# Patient Record
Sex: Male | Born: 1955 | Race: White | Hispanic: No | Marital: Married | Smoking: Never smoker
Health system: Southern US, Community
[De-identification: ages and names within clinical notes are randomized; demographics above are authoritative.]

## PROBLEM LIST (undated history)

## (undated) DIAGNOSIS — R51 Headache: Secondary | ICD-10-CM

## (undated) DIAGNOSIS — F329 Major depressive disorder, single episode, unspecified: Secondary | ICD-10-CM

## (undated) DIAGNOSIS — R519 Headache, unspecified: Secondary | ICD-10-CM

## (undated) DIAGNOSIS — G4733 Obstructive sleep apnea (adult) (pediatric): Secondary | ICD-10-CM

## (undated) DIAGNOSIS — Z8489 Family history of other specified conditions: Secondary | ICD-10-CM

## (undated) DIAGNOSIS — F419 Anxiety disorder, unspecified: Secondary | ICD-10-CM

## (undated) DIAGNOSIS — F32A Depression, unspecified: Secondary | ICD-10-CM

## (undated) DIAGNOSIS — R7303 Prediabetes: Secondary | ICD-10-CM

## (undated) DIAGNOSIS — H539 Unspecified visual disturbance: Secondary | ICD-10-CM

## (undated) DIAGNOSIS — G4700 Insomnia, unspecified: Secondary | ICD-10-CM

## (undated) HISTORY — DX: Obstructive sleep apnea (adult) (pediatric): G47.33

## (undated) HISTORY — DX: Anxiety disorder, unspecified: F41.9

## (undated) HISTORY — PX: DENTAL SURGERY: SHX609

## (undated) HISTORY — DX: Unspecified visual disturbance: H53.9

## (undated) HISTORY — DX: Major depressive disorder, single episode, unspecified: F32.9

## (undated) HISTORY — DX: Insomnia, unspecified: G47.00

## (undated) HISTORY — DX: Headache, unspecified: R51.9

## (undated) HISTORY — PX: VASECTOMY: SHX75

## (undated) HISTORY — DX: Headache: R51

## (undated) HISTORY — DX: Depression, unspecified: F32.A

---

## 2000-11-23 ENCOUNTER — Emergency Department (HOSPITAL_COMMUNITY): Admission: EM | Admit: 2000-11-23 | Discharge: 2000-11-24 | Payer: Self-pay | Admitting: Internal Medicine

## 2011-08-23 ENCOUNTER — Ambulatory Visit (INDEPENDENT_AMBULATORY_CARE_PROVIDER_SITE_OTHER): Payer: 59

## 2011-08-23 DIAGNOSIS — Z23 Encounter for immunization: Secondary | ICD-10-CM

## 2014-02-05 ENCOUNTER — Ambulatory Visit (INDEPENDENT_AMBULATORY_CARE_PROVIDER_SITE_OTHER): Payer: Managed Care, Other (non HMO) | Admitting: Physician Assistant

## 2014-02-05 VITALS — BP 94/60 | HR 67 | Temp 97.8°F | Ht 67.5 in | Wt 199.2 lb

## 2014-02-05 DIAGNOSIS — Z23 Encounter for immunization: Secondary | ICD-10-CM

## 2014-02-05 NOTE — Progress Notes (Signed)
Presents for Tdap vaccination.  First grandchild is due this summer.  1. Need for Tdap vaccination - Tdap vaccine greater than or equal to 58yo IM   Jackson Fetters S. Shaleta Ruacho, PA-C Physician Assistant-Certified [facility]  

## 2014-02-05 NOTE — Patient Instructions (Signed)
Congratulations!        PRE-OPERATIVE INSTRUCTIONS        Please do the body wash as directed below  Do not smoke after midnight the night before surgery - this will increase your risk of nausea and vomiting  Do not eat or drink anything for 8 hours before surgery. Failure to do this may cause cancellation of your procedure. You may drink CLEAR beverages (that you can see through) and your Ensure up to 2 hours before the procedure.   Please DO take the following medications with a sip of water on the day of your surgery: prisolec, miralax       Please go get your blood drawn on Friday, 04/25/22.   [facility] (inside main [facility])  [address], [address]  [place] [address]  [phone]  Monday-Friday: 7:30 a.m. - 5 p.m.  Sunday: 7:30 - 10:30 a.m.   Closed on Saturdays   * Will open at 7 a.m. Sundays starting December 23, 2020    [facility]  [address]  [place] [address]  [phone]  Monday-Friday: 8 a.m. - 4:30 p.m.    [facility]  [address]  [place] [address]  [phone]  Monday-Friday: 7:30 a.m. - 4:30 p.m.   *Appointment required      Day of Surgery  On the day of your surgery please leave valuable items including cash and credit cards at home if you can. You can expect:  You will be asked to remove any of the following items:  Dentures and bridges  Hearing aids  Contact lenses  Nail polish from at least one finger nail  Wigs, hairpins, combs and barrettes  You will be asked to remove all your clothes and put on a special gown  Your nursing staff may start an intravenous line (IV) attached to a tube that will supply your body with fluids and medications during and after surgery.      Preparing for your surgery - Chlorhexidine Washes    Shower with Chlorhexidine (CHG) soap to prevent infection.    Instructions:  You should shower with CHG soap a minimum of three times before your surgery, or more often as directed by  your surgeon.  You can buy CHG soap over the counter at any drug store.    Showering several times before surgery blocks germ growth and provides the best protection when used at least 3 times in a row.    two nights before surgery  the night before surgery  the morning of the admission to surgery    How to shower with CHG soap:    1. Rinse your body with warm water.    5. Lather again before rinsing.   2. Wash your hair with regular shampoo.  Rinse your hair with water. If you are having  neck surgery, use CHG soap instead of your regular shampoo to wash your hair.  Rinse your hair with water.    6.      7.   Turn on the water and rinse CHG off your body.    Dry off with a clean towel.   3. Wet a clean sponge. Turn off the water. Apply CHG liberally.  8. Do not apply lotions or powders.       4. Firmly massage all areas: neck, arms, chest, back, abdomen, hips, groin, genitals  (external only) and   buttocks. Clean your legs and feet and between your fingers and  toes. Pay special attention to the site of your surgery and all surrounding skin. Ask for help to clean your back if you have a spinal surgery.  9. Use clean clothes and freshly laundered bed linens.     --- Repeat steps 1 - 9  each time you shower. ---     Caution: When using CHG soap, avoid contact with eyes, nose, ear canals and mouth.  Important reminders:    Do not use any other soaps or body wash when using CHG. Other soaps can block the CHG benefits.  After showering, do not apply lotion, cream, powder, deodorant, or hair conditioner.  Do not shave or remove body hair. Facial shaving is permitted. If you are having head surgery, ask your doctor whether you can shave.  CHG is safe to use on minor wounds, rashes, burns, and over staples and stiches.  Allergic reactions are rare but may occur. If you have an allergic reaction, stop using CHG and call your doctor if you have a skin irritation.  If you are allergic to CHG, please follow the bathing  instructions above using an over-the-counter regular soap instead of CHG.

## 2014-04-01 ENCOUNTER — Ambulatory Visit (INDEPENDENT_AMBULATORY_CARE_PROVIDER_SITE_OTHER): Payer: Managed Care, Other (non HMO) | Admitting: Family Medicine

## 2014-04-01 VITALS — BP 104/72 | HR 72 | Temp 97.9°F | Resp 16 | Ht 68.0 in | Wt 194.8 lb

## 2014-04-01 DIAGNOSIS — M79609 Pain in unspecified limb: Secondary | ICD-10-CM

## 2014-04-01 DIAGNOSIS — S79929A Unspecified injury of unspecified thigh, initial encounter: Secondary | ICD-10-CM

## 2014-04-01 DIAGNOSIS — M79662 Pain in left lower leg: Secondary | ICD-10-CM

## 2014-04-01 DIAGNOSIS — S79919A Unspecified injury of unspecified hip, initial encounter: Secondary | ICD-10-CM

## 2014-04-01 DIAGNOSIS — S76302A Unspecified injury of muscle, fascia and tendon of the posterior muscle group at thigh level, left thigh, initial encounter: Secondary | ICD-10-CM

## 2014-04-01 NOTE — Progress Notes (Signed)
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 He comes in with left posterior thigh pain following a water ski injury. He noticed a sharp pain when he got up on his skis one week ago. He then developed persistent soreness in the mid thigh as well as a large ecchymotic area. He took measures to help us with icing it and taking ibuprofen and the pain is somewhat less now.  He seeks advice on what to do next.  Objective: Articulate gentleman in no acute distress Examination the left thigh reveals a 10 cm area of dense ecchymosis and diffuse swelling.  Assessment: Hamstring tear  Plan: Gentle stretching including swimming, continue the icing and ibuprofen. Expect this will take a month to heal.  Signed, Sheila OatsKurt Ottis Vacha M.D.

## 2014-12-14 DIAGNOSIS — G4700 Insomnia, unspecified: Secondary | ICD-10-CM | POA: Insufficient documentation

## 2014-12-14 DIAGNOSIS — M545 Low back pain, unspecified: Secondary | ICD-10-CM | POA: Insufficient documentation

## 2015-06-16 ENCOUNTER — Ambulatory Visit (INDEPENDENT_AMBULATORY_CARE_PROVIDER_SITE_OTHER): Payer: Managed Care, Other (non HMO) | Admitting: Family Medicine

## 2015-06-16 VITALS — BP 128/72 | HR 77 | Temp 98.0°F | Resp 18 | Ht 69.0 in | Wt 179.0 lb

## 2015-06-16 DIAGNOSIS — J069 Acute upper respiratory infection, unspecified: Secondary | ICD-10-CM | POA: Diagnosis not present

## 2015-06-16 DIAGNOSIS — R05 Cough: Secondary | ICD-10-CM | POA: Diagnosis not present

## 2015-06-16 DIAGNOSIS — R059 Cough, unspecified: Secondary | ICD-10-CM

## 2015-06-16 MED ORDER — AZITHROMYCIN 250 MG PO TABS: ORAL_TABLET | ORAL | Status: DC

## 2015-06-16 MED ORDER — HYDROCODONE-HOMATROPINE 5-1.5 MG/5ML PO SYRP: 5.0000 mL | ORAL_SOLUTION | ORAL | Status: DC | PRN

## 2015-06-16 MED ORDER — BENZONATATE 100 MG PO CAPS: 100.0000 mg | ORAL_CAPSULE | Freq: Three times a day (TID) | ORAL | Status: DC | PRN

## 2015-06-16 NOTE — Progress Notes (Signed)
Patient ID: Tom Webb, male    DOB: Feb 02, 1956  Age: 59 y.o. MRN: [id number]  Chief Complaint  Patient presents with  . Nasal Congestion    x 5 days  . Cough  . Sore Throat    Subjective:   59 year old man who has been ill all week with a respiratory tract infection. He has had head congestion, sore throat, and cough. The cough pretty persistent. Coworkers and wife insisted he get some additional care. He has not been running fevers. He is coughing up some mucus which is white and yellow. He has had head congestion and some sore throat. He does not smoke. He commutes over to [place] where he works, lives in [place]. Gen. he is a healthy man.  Current allergies, medications, problem list, past/family and social histories reviewed.  Objective:  BP 128/72 mmHg  Pulse 77  Temp(Src) 98 F (36.7 C)  Resp 18  Ht  (1.753 m)  Wt 179 lb (81.194 kg)  BMI 26.42 kg/m2  SpO2 98%  No major acute distress. His TMs are normal except for a little tympanosclerosis on the right drum. His throat is clear. Neck supple without nodes or thyromegaly. Chest is clear to auscultation. Only a little bit tight with forced expiration. No significant wheezing.  Assessment & Plan:   Assessment: 1. Viral upper respiratory infection   2. Cough       Plan: Will treat symptomatically with antibiotics backup if not improving    Meds ordered this encounter  Medications  . Acetaminophen (TYLENOL 8 HOUR ARTHRITIS PAIN PO)    Sig: Take by mouth.  Marland Kitchen aspirin 81 MG tablet    Sig: Take 81 mg by mouth daily.         Patient Instructions  Drink lots of fluids and get enough rest  Take the benzonatate 1 or 2 pills 3 times daily as needed for daytime cough  Take the Hycodan cough syrup 1 teaspoon every 4-6 hours as needed. Primarily use this at nighttime on weekends when can tolerate being a little bit sedated  Continues to use the Zyrtec. You might consider Zyrtec-D if the head seems to  congested.  If the secretions aren't too thick when you're coughing consider adding plain Mucinex to the above regimen  If you aren't getting worse, running fever, coughing up more foul mucus, etc., then begin the azithromycin which was 2 tablets initially then 1 daily for 4 days  If you do not use the antibiotics placed just destroyed the prescription  Return as needed     Return if symptoms worsen or fail to improve.   HOPPER,DAVID, MD 06/16/2015

## 2015-06-16 NOTE — Patient Instructions (Signed)
Drink lots of fluids and get enough rest  Take the benzonatate 1 or 2 pills 3 times daily as needed for daytime cough  Take the Hycodan cough syrup 1 teaspoon every 4-6 hours as needed. Primarily use this at nighttime on weekends when can tolerate being a little bit sedated  Continues to use the Zyrtec. You might consider Zyrtec-D if the head seems to congested.  If the secretions aren't too thick when you're coughing consider adding plain Mucinex to the above regimen  If you aren't getting worse, running fever, coughing up more foul mucus, etc., then begin the azithromycin which was 2 tablets initially then 1 daily for 4 days  If you do not use the antibiotics placed just destroyed the prescription  Return as needed

## 2015-12-20 DIAGNOSIS — F419 Anxiety disorder, unspecified: Secondary | ICD-10-CM | POA: Insufficient documentation

## 2016-02-12 ENCOUNTER — Ambulatory Visit (INDEPENDENT_AMBULATORY_CARE_PROVIDER_SITE_OTHER): Payer: 59

## 2016-02-12 ENCOUNTER — Ambulatory Visit (INDEPENDENT_AMBULATORY_CARE_PROVIDER_SITE_OTHER): Payer: 59 | Admitting: Podiatry

## 2016-02-12 ENCOUNTER — Encounter: Payer: Self-pay | Admitting: Podiatry

## 2016-02-12 VITALS — BP 96/57 | HR 77 | Resp 16

## 2016-02-12 DIAGNOSIS — L603 Nail dystrophy: Secondary | ICD-10-CM

## 2016-02-12 DIAGNOSIS — M205X2 Other deformities of toe(s) (acquired), left foot: Secondary | ICD-10-CM

## 2016-02-12 MED ORDER — MELOXICAM 15 MG PO TABS: 15.0000 mg | ORAL_TABLET | Freq: Every day | ORAL | Status: DC

## 2016-02-12 NOTE — Progress Notes (Signed)
   Subjective:    Patient ID: Tom Webb, male    DOB: 02/03/1956, 60 y.o.   MRN: [id number]  HPI    Review of Systems  Musculoskeletal: Positive for arthralgias.  All other systems reviewed and are negative.      Objective:   Physical Exam        Assessment & Plan:

## 2016-02-12 NOTE — Progress Notes (Signed)
   Subjective:    Patient ID: Tom Webb, male    DOB: 12/17/1955, 60 y.o.   MRN: [id number]   Subjective: He presents today with a 4 year history of pain to the first metatarsophalangeal joint of the left foot. He states the elliptical and hurts while walking and exercising. He saw an orthopedic surgeon approximately 2-3 years ago who recommended surgical fusion of the first metatarsophalangeal joint he declined at the time in lieu of an injection to the first metatarsophalangeal joint which resolved his symptoms for period of time. He currently takes arthritis strength Tylenol for his symptoms. He will be leaving for [place] next week and will be doing a lot of walking and requested discussion and possible medication. Foot Pain Associated symptoms include arthralgias.      Review of Systems  Musculoskeletal: Positive for arthralgias.  All other systems reviewed and are negative.      Objective:   Physical Exam: Vital signs are stable he is alert and oriented 3 have reviewed his past medical history medications allergies surgery social history and review of systems very pulses are strongly palpable. Neurologic sensorium is intact person's was C monofilament. Deep tendon reflexes are intact. Muscle strength is normal. He has very little in the way of motion for the first metatarsophalangeal joint which is painful on palpation as well as range of motion. Radiographs taken today do demonstrate moderate to severe osteoarthritic changes with dorsal spurring of the first metatarsophalangeal joint joint space narrowing eccentric in nature consistent with osteoarthritis of the first metatarsophalangeal joint of the left foot. He does have relatively normal cutaneous evaluation with exception of some discoloration to the medial aspect of the hallux nail plate left foot. He states this has come up recently and he would like to consider having this checked.        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  Plan: We discussed in great detail today her surgical options consisting of fusion versus arthroplasty with a silicone implant. I injected the area today after sterile Betadine skin prep with dexamethasone directly into the joint. This should allow for some alleviation of his symptoms. Also wrote him a prescription for meloxicam 15 mg 1 by mouth daily and I will follow-up with him once he returns from [place]. Samples of the nail skin were taken today to be sent for pathologic evaluation.

## 2016-03-13 ENCOUNTER — Encounter: Payer: Self-pay | Admitting: Podiatry

## 2016-03-13 ENCOUNTER — Ambulatory Visit (INDEPENDENT_AMBULATORY_CARE_PROVIDER_SITE_OTHER): Payer: 59 | Admitting: Podiatry

## 2016-03-13 VITALS — BP 104/69 | HR 88 | Resp 12

## 2016-03-13 DIAGNOSIS — M205X2 Other deformities of toe(s) (acquired), left foot: Secondary | ICD-10-CM

## 2016-03-13 DIAGNOSIS — L603 Nail dystrophy: Secondary | ICD-10-CM | POA: Diagnosis not present

## 2016-03-13 NOTE — Progress Notes (Signed)
He presents today for follow-up of his first metatarsophalangeal joint as well as the fungal biopsy. He states that while he was initially he did fairly well with the first metatarsophalangeal joint and the hallux limitus. He stopped taking his meloxicam last Thursday. He states that the pain level is around 1-2 generally.  Objective: Vital signs are stable he is alert and oriented 3 much decrease in edema and erythema around the first metatarsophalangeal joint left foot there is still severe hallux limitus present with crepitation. He does have a mold infection to his hallux nail plate.  Assessment: Nail dystrophy with onychomycosis and hallux limitus first metatarsal phalangeal joint left foot.  Plan: We discussed possible need for joint replacement today and also discussed the medication for him he would like to not take medication at this point time would like to consider doing this later. I will follow-up with him as needed.

## 2016-03-21 ENCOUNTER — Encounter: Payer: Self-pay | Admitting: Podiatry

## 2017-01-16 ENCOUNTER — Telehealth: Payer: Self-pay | Admitting: *Deleted

## 2017-01-16 MED ORDER — MELOXICAM 15 MG PO TABS: 15.0000 mg | ORAL_TABLET | Freq: Every day | ORAL | 0 refills | Status: DC

## 2017-01-16 NOTE — Telephone Encounter (Signed)
Toniann FailWendy [facility] states has sent refill request 2 times. I told her pt had not been seen since 03/2016 and needed an appt, but I would refill to get to the appt date.

## 2017-02-26 ENCOUNTER — Other Ambulatory Visit: Payer: Self-pay | Admitting: Family Medicine

## 2017-02-26 DIAGNOSIS — G8929 Other chronic pain: Secondary | ICD-10-CM

## 2017-02-26 DIAGNOSIS — M545 Low back pain: Principal | ICD-10-CM

## 2017-03-05 ENCOUNTER — Ambulatory Visit
Admission: RE | Admit: 2017-03-05 | Discharge: 2017-03-05 | Disposition: A | Discharge status: Home / Self Care | Payer: 59 | Source: Ambulatory Visit | Attending: Family Medicine | Admitting: Family Medicine

## 2017-03-05 DIAGNOSIS — M545 Low back pain: Principal | ICD-10-CM

## 2017-03-05 DIAGNOSIS — G8929 Other chronic pain: Secondary | ICD-10-CM

## 2017-03-05 MED ORDER — IOPAMIDOL (ISOVUE-M 200) INJECTION 41%
1.0000 mL | Freq: Once | INTRAMUSCULAR | Status: AC
  Administered 2017-03-05: 1 mL via EPIDURAL

## 2017-03-05 MED ORDER — METHYLPREDNISOLONE ACETATE 40 MG/ML INJ SUSP (RADIOLOG
120.0000 mg | Freq: Once | INTRAMUSCULAR | Status: AC
  Administered 2017-03-05: 120 mg via EPIDURAL

## 2017-03-05 NOTE — Discharge Instructions (Signed)

## 2017-07-20 ENCOUNTER — Ambulatory Visit (INDEPENDENT_AMBULATORY_CARE_PROVIDER_SITE_OTHER): Payer: 59 | Admitting: Family Medicine

## 2017-07-20 DIAGNOSIS — Z23 Encounter for immunization: Secondary | ICD-10-CM | POA: Diagnosis not present

## 2017-08-13 ENCOUNTER — Other Ambulatory Visit: Payer: Self-pay | Admitting: Family Medicine

## 2017-08-14 ENCOUNTER — Other Ambulatory Visit: Payer: Self-pay | Admitting: Family Medicine

## 2017-08-14 DIAGNOSIS — G8929 Other chronic pain: Secondary | ICD-10-CM

## 2017-08-14 DIAGNOSIS — M545 Low back pain: Principal | ICD-10-CM

## 2017-08-27 ENCOUNTER — Ambulatory Visit
Admission: RE | Admit: 2017-08-27 | Discharge: 2017-08-27 | Disposition: A | Discharge status: Home / Self Care | Payer: 59 | Source: Ambulatory Visit | Attending: Family Medicine | Admitting: Family Medicine

## 2017-08-27 ENCOUNTER — Other Ambulatory Visit: Payer: Self-pay | Admitting: Family Medicine

## 2017-08-27 DIAGNOSIS — G8929 Other chronic pain: Secondary | ICD-10-CM

## 2017-08-27 DIAGNOSIS — M545 Low back pain: Principal | ICD-10-CM

## 2017-08-27 MED ORDER — IOPAMIDOL (ISOVUE-M 200) INJECTION 41%: 1.0000 mL | Freq: Once | INTRAMUSCULAR | Status: DC

## 2017-08-27 MED ORDER — METHYLPREDNISOLONE ACETATE 40 MG/ML INJ SUSP (RADIOLOG: 120.0000 mg | Freq: Once | INTRAMUSCULAR | Status: DC

## 2017-08-27 NOTE — Discharge Instructions (Signed)

## 2018-02-01 ENCOUNTER — Encounter: Payer: Self-pay | Admitting: Family Medicine

## 2018-05-05 ENCOUNTER — Other Ambulatory Visit: Payer: Self-pay | Admitting: Family Medicine

## 2018-05-05 DIAGNOSIS — M545 Low back pain: Principal | ICD-10-CM

## 2018-05-05 DIAGNOSIS — G8929 Other chronic pain: Secondary | ICD-10-CM

## 2018-05-19 ENCOUNTER — Other Ambulatory Visit: Payer: Self-pay | Admitting: Family Medicine

## 2018-05-19 ENCOUNTER — Ambulatory Visit
Admission: RE | Admit: 2018-05-19 | Discharge: 2018-05-19 | Disposition: A | Discharge status: Home / Self Care | Payer: 59 | Source: Ambulatory Visit | Attending: Family Medicine | Admitting: Family Medicine

## 2018-05-19 DIAGNOSIS — M545 Low back pain: Principal | ICD-10-CM

## 2018-05-19 DIAGNOSIS — G8929 Other chronic pain: Secondary | ICD-10-CM

## 2018-05-19 MED ORDER — IOPAMIDOL (ISOVUE-M 200) INJECTION 41%
1.0000 mL | Freq: Once | INTRAMUSCULAR | Status: AC
  Administered 2018-05-19: 1 mL via EPIDURAL

## 2018-05-19 MED ORDER — METHYLPREDNISOLONE ACETATE 40 MG/ML INJ SUSP (RADIOLOG
120.0000 mg | Freq: Once | INTRAMUSCULAR | Status: AC
  Administered 2018-05-19: 120 mg via EPIDURAL

## 2018-05-19 NOTE — Discharge Instructions (Signed)

## 2018-07-27 ENCOUNTER — Ambulatory Visit (INDEPENDENT_AMBULATORY_CARE_PROVIDER_SITE_OTHER): Payer: 59 | Admitting: Neurology

## 2018-07-27 ENCOUNTER — Other Ambulatory Visit: Payer: Self-pay

## 2018-07-27 ENCOUNTER — Encounter: Payer: Self-pay | Admitting: Neurology

## 2018-07-27 DIAGNOSIS — H532 Diplopia: Secondary | ICD-10-CM | POA: Diagnosis not present

## 2018-07-27 NOTE — Progress Notes (Signed)
Reason for visit: Diplopia  Referring physician: Dr. Gershon Mussel is a 62 y.o. male  History of present illness:  Mr. Nikolai is a 62 year old right-handed white male with a history of an event that occurred on 03 Feb 2018.  The patient was in [place].  He had finished the canoeing, he was starting to develop some low back pain.  The patient took a Flexeril that afternoon.  Within 2 hours, the patient began having double vision that was horizontal in nature.  He remembers feeling spacey as if his mind and body were detached from one another.  He started slurring his speech, he was staggery.  His family called 911 and he was transported to the hospital.  The timeframe of symptoms lasted about 4 or 5 hours.  The patient had a patchy recollection of events during this timeframe, he did not D[place] out completely.  Eventually, he returned back to normal.  He has not taken any Flexeril since.  He had just started Flexeril 1 week prior to the event described above.  He had taken two previous tablets of Flexeril, both at nighttime.  The day of the onset of double vision was the first time he had taken the Flexeril during the daytime.  He has not taken Flexeril since that time.  The patient reported no focal numbness or weakness of the face, arms, legs.  He had no headache the day of the confusional event.  The patient has subsequently been placed on CPAP for sleep apnea.  The patient does have an occasional headache, but no clear history of migraine.  He is sent to this office for an evaluation.  A CT scan of the brain was done and was unremarkable.  Past Medical History:  Diagnosis Date  . Anxiety   . Depression   . Headache   . Insomnia   . OSA (obstructive sleep apnea)   . Vision abnormalities     Past Surgical History:  Procedure Laterality Date  . DENTAL SURGERY    . VASECTOMY      Family History  Problem Relation Age of Onset  . Tremor Mother   .  Syncope episode Father   . Cancer Paternal Grandfather   . Angioedema Sister   . Angioedema Brother     Social history:  reports that he has never smoked. He does not have any smokeless tobacco history on file. He reports that he does not drink alcohol. His drug history is not on file.  Medications:  Prior to Admission medications   Medication Sig Start Date End Date Taking? Authorizing Provider  Acetaminophen (TYLENOL 8 HOUR ARTHRITIS PAIN PO) Take by mouth.   Yes [provider]  aspirin EC 81 MG tablet Take by mouth.   Yes [provider]  buPROPion (WELLBUTRIN) 100 MG tablet Take 100 mg by mouth 2 (two) times daily.   Yes [provider]  escitalopram (LEXAPRO) 10 MG tablet Take by mouth. 12/20/15  Yes [provider]  meloxicam (MOBIC) 15 MG tablet Take 1 tablet (15 mg total) by mouth daily. 02/12/16  Yes Hyatt, Max T, DPM  zolpidem (AMBIEN) 10 MG tablet TAKE 1 TABLET AT BEDTIME AS NEEDED. 04/05/18  Yes [provider]     No Known Allergies  ROS:  Out of a complete 14 system review of symptoms, the patient complains only of the following symptoms, and all other reviewed systems are negative.  Transient  double vision Right-sided sciatica  Blood pressure 130/81, pulse 76, height 5' 7.5" (1.715 m), weight 194 lb (88 kg).  Physical Exam  General: The patient is alert and cooperative at the time of the examination.  Eyes: Pupils are equal, round, and reactive to light. Discs are flat bilaterally.  Neck: The neck is supple, no carotid bruits are noted.  Respiratory: The respiratory examination is clear.  Cardiovascular: The cardiovascular examination reveals a regular rate and rhythm, no obvious murmurs or rubs are noted.  Skin: Extremities are without significant edema.  Neurologic Exam  Mental status: The patient is alert and oriented x 3 at the time of the examination. The patient has apparent normal recent and remote memory,  with an apparently normal attention span and concentration ability.  Cranial nerves: Facial symmetry is present. There is good sensation of the face to pinprick and soft touch bilaterally. The strength of the facial muscles and the muscles to head turning and shoulder shrug are normal bilaterally. Speech is well enunciated, no aphasia or dysarthria is noted. Extraocular movements are full. Visual fields are full. The tongue is midline, and the patient has symmetric elevation of the soft palate. No obvious hearing deficits are noted.  Motor: The motor testing reveals 5 over 5 strength of all 4 extremities. Good symmetric motor tone is noted throughout.  Sensory: Sensory testing is intact to pinprick, soft touch, vibration sensation, and position sense on all 4 extremities. No evidence of extinction is noted.  Coordination: Cerebellar testing reveals good finger-nose-finger and heel-to-shin bilaterally.  Gait and station: Gait is normal. Tandem gait is normal. Romberg is negative. No drift is seen.  Reflexes: Deep tendon reflexes are symmetric and normal bilaterally. Toes are downgoing bilaterally.   Assessment/Plan:  1.  Transient alteration of awareness, double vision  The patient likely had an event associated with the daytime use of Flexeril.  Flexeril is highly anticholinergic and can cause double vision in someone with latent strabismus.  In susceptible individuals, this may cause confusion, slurred speech, and gait instability.  The patient will be sent for MRI of the brain, if there is no evidence of cerebrovascular disease, no further work-up will be done.  If an old stroke in the brainstem area is noted, further evaluation may be warranted.  The patient currently is on low-dose aspirin.  Marlan Palau. Keith Willis MD 07/27/2018 3:30 PM  [facility] [address] [place], [place] [id number]  Phone [phone] Fax [phone]

## 2018-07-28 ENCOUNTER — Telehealth: Payer: Self-pay | Admitting: Neurology

## 2018-07-28 NOTE — Telephone Encounter (Signed)
Patient is aware I gave him GI phone number of [phone] and to give them a call if he has not heard in the next 2-3 business days.  °

## 2018-07-28 NOTE — Telephone Encounter (Signed)
[employer] order sent to GI. They obtain the auth and will reach out to the pt to schedule.  °

## 2018-08-15 ENCOUNTER — Telehealth: Payer: Self-pay | Admitting: Neurology

## 2018-08-15 ENCOUNTER — Ambulatory Visit
Admission: RE | Admit: 2018-08-15 | Discharge: 2018-08-15 | Disposition: A | Discharge status: Home / Self Care | Payer: 59 | Source: Ambulatory Visit | Attending: Neurology | Admitting: Neurology

## 2018-08-15 DIAGNOSIS — H532 Diplopia: Secondary | ICD-10-CM

## 2018-08-15 NOTE — Telephone Encounter (Signed)
  I called the patient. MRI of the brain is normal.   MRI brain 08/15/18:  IMPRESSION:  Unremarkable MRI scan of brain without contrast.

## 2019-01-24 DIAGNOSIS — Z9989 Dependence on other enabling machines and devices: Secondary | ICD-10-CM | POA: Insufficient documentation

## 2019-06-17 ENCOUNTER — Other Ambulatory Visit: Payer: Self-pay

## 2019-06-17 DIAGNOSIS — Z20822 Contact with and (suspected) exposure to covid-19: Secondary | ICD-10-CM

## 2019-06-18 LAB — NOVEL CORONAVIRUS, NAA: SARS-CoV-2, NAA: NOT DETECTED

## 2019-09-10 ENCOUNTER — Encounter (HOSPITAL_COMMUNITY): Payer: Self-pay

## 2019-09-10 ENCOUNTER — Ambulatory Visit (HOSPITAL_COMMUNITY)
Admission: EM | Admit: 2019-09-10 | Discharge: 2019-09-10 | Disposition: A | Discharge status: Home / Self Care | Payer: BC Managed Care – PPO | Attending: Family Medicine | Admitting: Family Medicine

## 2019-09-10 DIAGNOSIS — Z20822 Contact with and (suspected) exposure to covid-19: Secondary | ICD-10-CM | POA: Diagnosis not present

## 2019-09-10 NOTE — ED Triage Notes (Signed)
Pt has been exposed by someone who possible has covid 19 depending on when that person receive there lab result. Pt denies any symptoms.

## 2019-09-10 NOTE — Discharge Instructions (Addendum)
If your Covid-19 test is positive, you will receive a phone call from [facility] regarding your results. Negative test results are not called. Both positive and negative results area always visible on MyChart. If you do not have a MyChart account, sign up instructions are in your discharge papers.  

## 2019-09-11 LAB — NOVEL CORONAVIRUS, NAA (HOSP ORDER, SEND-OUT TO REF LAB; TAT 18-24 HRS): SARS-CoV-2, NAA: NOT DETECTED

## 2019-09-12 NOTE — ED Provider Notes (Signed)
[facility]   [id number] 09/10/19 Arrival Time: 1539  ASSESSMENT & PLAN:  1. Exposure to COVID-19 virus      COVID-19 testing sent. To self-quarantine until results are available. If requested, work note provided.  Follow-up Information    Kandyce Rud, MD.   Specialty: Family Medicine Why: As needed. Contact information: [address] [facility] [place] [address] [phone]           Reviewed expectations re: course of current medical issues. Questions answered. Outlined signs and symptoms indicating need for more acute intervention. Patient verbalized understanding. After Visit Summary given.   SUBJECTIVE: History from: patient. Tom Webb is a 64 y.o. male who requests COVID-19 testing. Known COVID-19 contact: he thinks so. Recent travel: none. Denies: runny nose, congestion, fever, cough, sore throat, difficulty breathing and headache. Normal PO intake without n/v/d.  ROS: As per HPI.   OBJECTIVE:  Vitals:   09/10/19 1651  BP: 119/65  Pulse: 71  Resp: 18  Temp: 98.5 F (36.9 C)  TempSrc: Oral  SpO2: 99%    General appearance: alert; no distress Eyes: PERRLA; EOMI; conjunctiva normal HENT: [place]; AT; nasal mucosa normal; oral mucosa normal Neck: supple  Lungs: speaks full sentences without difficulty; unlabored Heart: regular rate and rhythm Abdomen: soft, non-tender Extremities: no edema Skin: warm and dry Neurologic: normal gait Psychological: alert and cooperative; normal mood and affect  Labs:  Labs Reviewed  NOVEL CORONAVIRUS, NAA (HOSP ORDER, SEND-OUT TO REF LAB; TAT 18-24 HRS)      No Known Allergies  Past Medical History:  Diagnosis Date  . Anxiety   . Depression   . Headache   . Insomnia   . OSA (obstructive sleep apnea)   . Vision abnormalities    Social History   Socioeconomic History  . Marital status: Married    Spouse name: Not on file  . Number of  children: Not on file  . Years of education: Not on file  . Highest education level: Not on file  Occupational History  . Not on file  Tobacco Use  . Smoking status: Never Smoker  Substance and Sexual Activity  . Alcohol use: No  . Drug use: Not on file  . Sexual activity: Not on file  Other Topics Concern  . Not on file  Social History Narrative  . Not on file   Social Determinants of Health   Financial Resource Strain:   . Difficulty of Paying Living Expenses: Not on file  Food Insecurity:   . Worried About Programme researcher, broadcasting/film/video in the Last Year: Not on file  . Ran Out of Food in the Last Year: Not on file  Transportation Needs:   . Lack of Transportation (Medical): Not on file  . Lack of Transportation (Non-Medical): Not on file  Physical Activity:   . Days of Exercise per Week: Not on file  . Minutes of Exercise per Session: Not on file  Stress:   . Feeling of Stress : Not on file  Social Connections:   . Frequency of Communication with Friends and Family: Not on file  . Frequency of Social Gatherings with Friends and Family: Not on file  . Attends Religious Services: Not on file  . Active Member of Clubs or Organizations: Not on file  . Attends Banker Meetings: Not on file  . Marital Status: Not on file  Intimate Partner Violence:   . Fear of  Current or Ex-Partner: Not on file  . Emotionally Abused: Not on file  . Physically Abused: Not on file  . Sexually Abused: Not on file   Family History  Problem Relation Age of Onset  . Tremor Mother   . Syncope episode Father   . Cancer Paternal Grandfather   . Angioedema Sister   . Angioedema Brother    Past Surgical History:  Procedure Laterality Date  . DENTAL SURGERY    . Lady Deutscher, MD 09/12/19 [phone]

## 2019-12-20 ENCOUNTER — Other Ambulatory Visit: Payer: Self-pay | Admitting: Family Medicine

## 2019-12-20 DIAGNOSIS — M5416 Radiculopathy, lumbar region: Secondary | ICD-10-CM

## 2019-12-29 ENCOUNTER — Other Ambulatory Visit: Payer: BC Managed Care – PPO

## 2020-01-10 ENCOUNTER — Ambulatory Visit
Admission: RE | Admit: 2020-01-10 | Discharge: 2020-01-10 | Disposition: A | Discharge status: Home / Self Care | Payer: BC Managed Care – PPO | Source: Ambulatory Visit | Attending: Family Medicine | Admitting: Family Medicine

## 2020-01-10 ENCOUNTER — Other Ambulatory Visit: Payer: Self-pay

## 2020-01-10 DIAGNOSIS — M5416 Radiculopathy, lumbar region: Secondary | ICD-10-CM

## 2020-01-10 MED ORDER — IOPAMIDOL (ISOVUE-M 200) INJECTION 41%
1.0000 mL | Freq: Once | INTRAMUSCULAR | Status: AC
  Administered 2020-01-10: 1 mL via EPIDURAL

## 2020-01-10 MED ORDER — METHYLPREDNISOLONE ACETATE 40 MG/ML INJ SUSP (RADIOLOG
120.0000 mg | Freq: Once | INTRAMUSCULAR | Status: AC
  Administered 2020-01-10: 120 mg via EPIDURAL

## 2020-01-10 NOTE — Discharge Instructions (Signed)

## 2021-07-17 ENCOUNTER — Other Ambulatory Visit: Payer: Self-pay | Admitting: Family Medicine

## 2021-07-17 DIAGNOSIS — M545 Low back pain, unspecified: Secondary | ICD-10-CM

## 2021-07-18 ENCOUNTER — Other Ambulatory Visit: Payer: Self-pay | Admitting: Family Medicine

## 2021-07-18 ENCOUNTER — Ambulatory Visit
Admission: RE | Admit: 2021-07-18 | Discharge: 2021-07-18 | Disposition: A | Discharge status: Home / Self Care | Payer: BC Managed Care – PPO | Source: Ambulatory Visit | Attending: Family Medicine | Admitting: Family Medicine

## 2021-07-18 ENCOUNTER — Other Ambulatory Visit: Payer: Self-pay

## 2021-07-18 DIAGNOSIS — M545 Low back pain, unspecified: Secondary | ICD-10-CM

## 2021-07-18 MED ORDER — METHYLPREDNISOLONE ACETATE 40 MG/ML INJ SUSP (RADIOLOG
80.0000 mg | Freq: Once | INTRAMUSCULAR | Status: AC
  Administered 2021-07-18: 80 mg via EPIDURAL

## 2021-07-18 MED ORDER — IOPAMIDOL (ISOVUE-M 200) INJECTION 41%
1.0000 mL | Freq: Once | INTRAMUSCULAR | Status: AC
  Administered 2021-07-18: 1 mL via EPIDURAL

## 2021-07-18 NOTE — Discharge Instructions (Signed)

## 2021-08-21 ENCOUNTER — Other Ambulatory Visit: Payer: Self-pay

## 2021-08-21 ENCOUNTER — Encounter (HOSPITAL_BASED_OUTPATIENT_CLINIC_OR_DEPARTMENT_OTHER): Payer: Self-pay | Admitting: Emergency Medicine

## 2021-08-21 ENCOUNTER — Emergency Department (HOSPITAL_BASED_OUTPATIENT_CLINIC_OR_DEPARTMENT_OTHER)
Admission: EM | Admit: 2021-08-21 | Discharge: 2021-08-21 | Disposition: A | Discharge status: Home / Self Care | Payer: BC Managed Care – PPO | Attending: Emergency Medicine | Admitting: Emergency Medicine

## 2021-08-21 ENCOUNTER — Emergency Department (HOSPITAL_BASED_OUTPATIENT_CLINIC_OR_DEPARTMENT_OTHER): Payer: BC Managed Care – PPO

## 2021-08-21 DIAGNOSIS — S61211A Laceration without foreign body of left index finger without damage to nail, initial encounter: Secondary | ICD-10-CM | POA: Diagnosis not present

## 2021-08-21 DIAGNOSIS — Z23 Encounter for immunization: Secondary | ICD-10-CM | POA: Insufficient documentation

## 2021-08-21 DIAGNOSIS — W312XXA Contact with powered woodworking and forming machines, initial encounter: Secondary | ICD-10-CM | POA: Insufficient documentation

## 2021-08-21 MED ORDER — LIDOCAINE HCL (PF) 1 % IJ SOLN
5.0000 mL | Freq: Once | INTRAMUSCULAR | Status: AC
  Administered 2021-08-21: 13:00:00 5 mL
  Filled 2021-08-21: qty 5

## 2021-08-21 MED ORDER — HYDROCODONE-ACETAMINOPHEN 5-325 MG PO TABS: 1.0000 | ORAL_TABLET | ORAL | 0 refills | Status: DC | PRN

## 2021-08-21 MED ORDER — HYDROCODONE-ACETAMINOPHEN 5-325 MG PO TABS: 1.0000 | ORAL_TABLET | Freq: Once | ORAL | Status: DC

## 2021-08-21 MED ORDER — TETANUS-DIPHTH-ACELL PERTUSSIS 5-2.5-18.5 LF-MCG/0.5 IM SUSY
0.5000 mL | PREFILLED_SYRINGE | Freq: Once | INTRAMUSCULAR | Status: AC
  Administered 2021-08-21: 13:00:00 0.5 mL via INTRAMUSCULAR
  Filled 2021-08-21: qty 0.5

## 2021-08-21 NOTE — ED Triage Notes (Signed)
Pt reports cutting left index finger with power saw approx 20 minutes prior to arrival. Unsure of TDAP status.

## 2021-08-21 NOTE — ED Notes (Signed)
RN provided AVS using Teachback Method. Patient verbalizes understanding of Discharge Instructions. Opportunity for Questioning and Answers were provided by RN. Patient Discharged from ED ambulatory to home. ° °

## 2021-08-21 NOTE — ED Provider Notes (Signed)
[facility] EMERGENCY DEPT Provider Note   CSN: [id number] Arrival date & time: 08/21/21  1212     History Chief Complaint  Patient presents with   Finger Injury    Tom Webb is a 65 y.o. male.  64 year old male presents with laceration to left index finger caused by a power saw 20 minutes prior to arrival.  Last tetanus unknown.  Not anticoagulated.  He is right-hand dominant.  No other injuries, complaints, concerns.      Past Medical History:  Diagnosis Date   Anxiety    Depression    Headache    Insomnia    OSA (obstructive sleep apnea)    Vision abnormalities     Patient Active Problem List   Diagnosis Date Noted   OSA on CPAP 01/24/2019   Anxiety 12/20/2015   Insomnia 12/14/2014   LBP (low back pain) 12/14/2014    Past Surgical History:  Procedure Laterality Date   DENTAL SURGERY     VASECTOMY         Family History  Problem Relation Age of Onset   Tremor Mother    Syncope episode Father    Cancer Paternal Grandfather    Angioedema Sister    Angioedema Brother     Social History   Tobacco Use   Smoking status: Never  Substance Use Topics   Alcohol use: No    Home Medications Prior to Admission medications   Medication Sig Start Date End Date Taking? Authorizing Provider  buPROPion (WELLBUTRIN XL) 150 MG 24 hr tablet Take 150 mg by mouth every morning. 08/14/19  Yes [provider]  escitalopram (LEXAPRO) 10 MG tablet Take by mouth. 12/20/15  Yes [provider]  HYDROcodone-acetaminophen (NORCO/VICODIN) 5-325 MG tablet Take 1 tablet by mouth every 4 (four) hours as needed. 08/21/21  Yes Jeannie Fend, PA-C  Misc Natural Products (FOCUSED MIND PO) Take 1 tablet by mouth 2 (two) times daily.   Yes [provider]  zolpidem (AMBIEN) 10 MG tablet TAKE 1 TABLET AT BEDTIME AS NEEDED. 04/05/18  Yes [provider]  Acetaminophen (TYLENOL 8 HOUR ARTHRITIS PAIN PO) Take by mouth.    [provider]  aspirin EC 81 MG tablet Take by mouth.    [provider]  meloxicam (MOBIC) 15 MG tablet Take 1 tablet (15 mg total) by mouth daily. 02/12/16   Hyatt, Max T, DPM    Allergies    Patient has no known allergies.  Review of Systems   Review of Systems  Constitutional:  Negative for fever.  Musculoskeletal:  Positive for arthralgias, joint swelling and myalgias.  Skin:  Positive for wound. Negative for color change and rash.  Allergic/Immunologic: Negative for immunocompromised state.  Neurological:  Negative for weakness and numbness.  Hematological:  Does not bruise/bleed easily.  Psychiatric/Behavioral:  Negative for self-injury.   All other systems reviewed and are negative.  Physical Exam Updated Vital Signs BP (!) 154/89 (BP Location: Right Arm)    Pulse 67    Temp 98.2 F (36.8 C)    Resp 18    Ht 5' 7.5" (1.715 m)    Wt 88 kg    SpO2 94%    BMI 29.94 kg/m   Physical Exam Vitals and nursing note reviewed.  Constitutional:      General: He is not in acute distress.    Appearance: He is well-developed. He is not diaphoretic.  HENT:     Head: Normocephalic and atraumatic.  Cardiovascular:     Pulses: Normal pulses.  Pulmonary:     Effort: Pulmonary effort is normal.  Musculoskeletal:        General: Swelling, tenderness and signs of injury present. No deformity.     Comments: Laceration to the pad of the left index finger with mild active bleeding.  Normal range of motion of the finger.  Brisk capillary refill present, sensation intact.  Does not involve the nailbed.  Skin:    General: Skin is warm and dry.     Findings: No erythema or rash.  Neurological:     Mental Status: He is alert and oriented to person, place, and time.     Sensory: No sensory deficit.     Motor: No weakness.  Psychiatric:        Behavior: Behavior normal.       ED Results / Procedures / Treatments   Labs (all labs ordered are listed, but only abnormal results are  displayed) Labs Reviewed - No data to display  EKG None  Radiology DG Finger Index Left  Result Date: 08/21/2021 CLINICAL DATA:  Left index finger power saw injury. EXAM: LEFT INDEX FINGER 2+V COMPARISON:  None. FINDINGS: Overlying bandage material obscures soft tissue detail of the distal index finger. No acute fracture or dislocation. No radiopaque foreign body identified. Joint spaces are preserved. IMPRESSION: 1. No acute osseous abnormality. Electronically Signed   By: Obie Dredge M.D.   On: 08/21/2021 12:44    Procedures .Marland KitchenLaceration Repair  Date/Time: 08/21/2021 2:24 PM Performed by: Jeannie Fend, PA-C Authorized by: Jeannie Fend, PA-C   Consent:    Consent obtained:  Verbal   Consent given by:  Patient   Risks discussed:  Infection, need for additional repair, pain, poor cosmetic result and poor wound healing   Alternatives discussed:  No treatment and delayed treatment Universal protocol:    Procedure explained and questions answered to patient or proxy's satisfaction: yes     Relevant documents present and verified: yes     Test results available: yes     Imaging studies available: yes     Required blood products, implants, devices, and special equipment available: yes     Site/side marked: yes     Immediately prior to procedure, a time out was called: yes     Patient identity confirmed:  Verbally with patient Anesthesia:    Anesthesia method:  Nerve block   Block needle gauge:  25 G   Block anesthetic:  Lidocaine 1% w/o epi   Block technique:  Digital block   Block injection procedure:  Anatomic landmarks identified, anatomic landmarks palpated, introduced needle and incremental injection   Block outcome:  Anesthesia achieved Laceration details:    Location:  Finger   Finger location:  L index finger   Length (cm):  1.5   Depth (mm):  3 Pre-procedure details:    Preparation:  Patient was prepped and draped in usual sterile fashion and imaging  obtained to evaluate for foreign bodies Exploration:    Hemostasis achieved with:  Tourniquet   Imaging obtained: x-ray     Imaging outcome: foreign body not noted     Wound exploration: wound explored through full range of motion and entire depth of wound visualized     Wound extent: no foreign bodies/material noted, no muscle damage noted, no nerve damage noted, no tendon damage noted and no underlying fracture noted     Contaminated: no   Treatment:  Area cleansed with:  Saline   Amount of cleaning:  Extensive   Irrigation solution:  Sterile saline Skin repair:    Repair method:  Sutures   Suture size:  4-0   Suture material:  Nylon   Suture technique:  Simple interrupted   Number of sutures:  4 Approximation:    Approximation:  Close Repair type:    Repair type:  Simple Post-procedure details:    Dressing:  Splint for protection   Procedure completion:  Tolerated well, no immediate complications   Medications Ordered in ED Medications  HYDROcodone-acetaminophen (NORCO/VICODIN) 5-325 MG per tablet 1 tablet (has no administration in time range)  lidocaine (PF) (XYLOCAINE) 1 % injection 5 mL (5 mLs Infiltration Given 08/21/21 1327)  Tdap (BOOSTRIX) injection 0.5 mL (0.5 mLs Intramuscular Given 08/21/21 1327)    ED Course  I have reviewed the triage vital signs and the nursing notes.  Pertinent labs & imaging results that were available during my care of the patient were reviewed by me and considered in my medical decision making (see chart for details).  Clinical Course as of 08/21/21 1427  Wed Aug 21, 2021  2449 65 year old male with laceration to left index finger working with a power saw today.  Has normal range of motion of finger, sensation intact, brisk cap refill present.  X-rays negative for fracture retained foreign body.  Wound was anesthetized with digital block, thoroughly irrigated with normal saline and closed with 4 simple interrupted nylon sutures.  Splint  placed for protection.  Range of motion evaluated afterward and remains intact.  Discussed possibility of tendon injury not appreciated on exam today, if he has any weakness or concerns for the finger, he should follow-up with hand Ortho, patient states he sees Dr. Amanda Pea.  Patient to follow-up with PCP in 2 days for wound check, suture removal in about 10 days.  Tetanus was updated today. [LM]    Clinical Course User Index [LM] Alden Hipp   MDM Rules/Calculators/A&P                           Final Clinical Impression(s) / ED Diagnoses Final diagnoses:  Laceration of left index finger without foreign body without damage to nail, initial encounter    Rx / DC Orders ED Discharge Orders          Ordered    HYDROcodone-acetaminophen (NORCO/VICODIN) 5-325 MG tablet  Every 4 hours PRN        08/21/21 1426             Jeannie Fend, PA-C 08/21/21 1427    Sloan Leiter, DO 08/21/21 1604

## 2021-08-21 NOTE — ED Notes (Signed)
PA at Bedside. 

## 2021-08-21 NOTE — Discharge Instructions (Signed)
Wound recheck with your doctor in 2 days.  Suture removal in 10 days if appropriate at that time.  Use splint to protect laceration as needed otherwise, may cover with Band-Aid removing Band-Aid periodically to let the wound breathe. Keep wound clean and dry for the first 48 hours, after this time, you may clean with mild soap on a Q-tip.

## 2023-02-11 ENCOUNTER — Encounter (HOSPITAL_COMMUNITY): Payer: Self-pay | Admitting: Emergency Medicine

## 2023-02-11 ENCOUNTER — Emergency Department (HOSPITAL_COMMUNITY)
Admission: EM | Admit: 2023-02-11 | Discharge: 2023-02-11 | Disposition: A | Discharge status: Home / Self Care | Payer: Medicare Other | Attending: Emergency Medicine | Admitting: Emergency Medicine

## 2023-02-11 ENCOUNTER — Other Ambulatory Visit: Payer: Self-pay

## 2023-02-11 DIAGNOSIS — S92001D Unspecified fracture of right calcaneus, subsequent encounter for fracture with routine healing: Secondary | ICD-10-CM | POA: Insufficient documentation

## 2023-02-11 DIAGNOSIS — Z7982 Long term (current) use of aspirin: Secondary | ICD-10-CM | POA: Insufficient documentation

## 2023-02-11 DIAGNOSIS — M79641 Pain in right hand: Secondary | ICD-10-CM | POA: Diagnosis present

## 2023-02-11 DIAGNOSIS — W19XXXD Unspecified fall, subsequent encounter: Secondary | ICD-10-CM | POA: Insufficient documentation

## 2023-02-11 MED ORDER — GABAPENTIN 300 MG PO CAPS
300.0000 mg | ORAL_CAPSULE | Freq: Once | ORAL | Status: AC
  Administered 2023-02-11: 300 mg via ORAL
  Filled 2023-02-11: qty 1

## 2023-02-11 MED ORDER — OXYCODONE HCL 5 MG PO TABS
ORAL_TABLET | ORAL | 0 refills | Status: AC
Start: 2023-02-11 — End: ?

## 2023-02-11 MED ORDER — MORPHINE SULFATE (PF) 4 MG/ML IV SOLN
4.0000 mg | Freq: Once | INTRAVENOUS | Status: AC
  Administered 2023-02-11: 4 mg via INTRAMUSCULAR
  Filled 2023-02-11: qty 1

## 2023-02-11 NOTE — ED Provider Notes (Signed)
[place] EMERGENCY DEPARTMENT AT [facility] Provider Note   CSN: [id number] Arrival date & time: 02/11/23  0205     History  Chief Complaint  Patient presents with   Foot Injury    R    Tom Webb is a 67 y.o. male.  Patient presents to the emergency department complaining of right foot pain secondary to an injury.  Patient was evaluated earlier on Tuesday at [facility] in [place] after a mechanical fall.  Patient reports that he has a calcaneus fracture with plans for outpatient follow-up later today with orthopedics.  The patient states that once getting home he was unable to tolerate the pain and came here for pain control.  The patient is currently in a splint.  Past medical history significant for obstructive sleep apnea, headaches, depression, anxiety  HPI     Home Medications Prior to Admission medications   Medication Sig Start Date End Date Taking? Authorizing Provider  oxyCODONE (ROXICODONE) 5 MG immediate release tablet Take 1-2 tablets every 6 hours for severe pain 02/11/23  Yes Darrick Grinder, PA-C  Acetaminophen (TYLENOL 8 HOUR ARTHRITIS PAIN PO) Take by mouth.    [provider]  aspirin EC 81 MG tablet Take by mouth.    [provider]  buPROPion (WELLBUTRIN XL) 150 MG 24 hr tablet Take 150 mg by mouth every morning. 08/14/19   [provider]  escitalopram (LEXAPRO) 10 MG tablet Take by mouth. 12/20/15   [provider]  HYDROcodone-acetaminophen (NORCO/VICODIN) 5-325 MG tablet Take 1 tablet by mouth every 4 (four) hours as needed. 08/21/21   Jeannie Fend, PA-C  meloxicam (MOBIC) 15 MG tablet Take 1 tablet (15 mg total) by mouth daily. 02/12/16   Hyatt, Max T, DPM  Misc Natural Products (FOCUSED MIND PO) Take 1 tablet by mouth 2 (two) times daily.    [provider]  zolpidem (AMBIEN) 10 MG tablet TAKE 1 TABLET AT BEDTIME AS NEEDED. 04/05/18   [provider]      Allergies    Patient has  no known allergies.    Review of Systems   Review of Systems  Physical Exam Updated Vital Signs BP 120/73 (BP Location: Right Arm)   Pulse 68   Temp 97.7 F (36.5 C)   Resp 20   SpO2 100%  Physical Exam Vitals and nursing note reviewed.  HENT:     Head: Normocephalic and atraumatic.  Eyes:     Pupils: Pupils are equal, round, and reactive to light.  Pulmonary:     Effort: Pulmonary effort is normal. No respiratory distress.  Musculoskeletal:        General: Tenderness and signs of injury present.     Cervical back: Normal range of motion.     Comments: Patient's right foot in splint.  Patient able to wiggle his toes without difficulty.  Brisk cap refill in patient's toes.  Patient has normal sensation in toes.  Skin:    General: Skin is dry.  Neurological:     Mental Status: He is alert.  Psychiatric:        Speech: Speech normal.        Behavior: Behavior normal.    ED Results / Procedures / Treatments   Labs (all labs ordered are listed, but only abnormal results are displayed) Labs Reviewed - No data to display  EKG None  Radiology No results found.  Procedures Procedures    Medications Ordered in ED Medications  morphine (PF) 4 MG/ML injection 4 mg (4 mg Intramuscular Given 02/11/23 0322)    ED Course/ Medical Decision Making/ A&P                             Medical Decision Making Risk Prescription drug management.   Patient presents to the emergency department for pain management due to a calcaneal fracture.  I reviewed outside medical documentation including notes from [facility] clinic from earlier today which recommended the patient follow-up with orthopedics.  I also reviewed outside imaging results.Severely comminuted fracture of the calcaneus with intra-articular extension to the subtalar joint. No dislocation demonstrated.   I ordered the patient morphine for pain.  Upon reassessment the patient was feeling better.  I see no indication at  this time for repeat imaging.  The patient has had no further  injury and states he has had fairly consistent pain since the initial injury with mild relief after IV meds and shortly after hydrocodone.  Plan to discharge patient at this time with oxycodone prescription to escalate pain management.  Patient will use this instead of hydrocodone has been informed to not use both medications.  Patient will also take scheduled Tylenol and ibuprofen.  Patient states he has plans for orthopedic follow-up later today and will keep that appointment.  There is no indication at this time for admission.  Discharge home.        Final Clinical Impression(s) / ED Diagnoses Final diagnoses:  Closed nondisplaced fracture of right calcaneus with routine healing, unspecified portion of calcaneus, subsequent encounter    Rx / DC Orders ED Discharge Orders          Ordered    oxyCODONE (ROXICODONE) 5 MG immediate release tablet        02/11/23 0343              Darrick Grinder, PA-C 02/11/23 8469    Gilda Crease, MD 02/11/23 [phone]

## 2023-02-11 NOTE — ED Triage Notes (Signed)
Pt presents via POV d/t concerns for right lower foot pain following a fall while hiking. Was seen at [facility] presents with splint to RLE. Imaging showed a closed non-displaced fx of the right calcaneus. Was prescribed oxycodone, last took around 10:30 pm, sts no relief of pain. Per discharge instructions, pt was recommended re-evaluation if significantly worsening pain.   Pt had CD of imaging completed at [facility].

## 2023-02-11 NOTE — Discharge Instructions (Addendum)
You were seen tonight for pain control due to a fracture of your right calcaneus (heel).  I have prescribed oxycodone to be taken instead of the hydrocodone that you were previously given.  I also recommend taking 1000 mg of Tylenol every 6 hours along with 600 mg of ibuprofen every 6 hours.  You may stagger the ibuprofen and Tylenol so as you are getting 1 medication every 3 hours.  The oxycodone is for breakthrough pain.  Please keep your planned visit with orthopedics for later today.  If you develop any life-threatening symptoms please return to the emergency department.

## 2023-02-17 ENCOUNTER — Other Ambulatory Visit: Payer: Self-pay | Admitting: Orthopaedic Surgery

## 2023-02-19 ENCOUNTER — Encounter (HOSPITAL_COMMUNITY): Payer: Self-pay

## 2023-02-19 NOTE — Progress Notes (Signed)
Surgical Instructions    Your procedure is scheduled on Thursday, June 20th,2024.   Report to [facility] Main Entrance "A" at 11:15 A.M., then check in with the Admitting office.  Call this number if you have problems the morning of surgery:  [phone]   If you have any questions prior to your surgery date call [phone]: Open Monday-Friday 8am-4pm If you experience any cold or flu symptoms such as cough, fever, chills, shortness of breath, etc. between now and your scheduled surgery, please notify us at the above number     Remember:  Do not eat after midnight the night before your surgery  You may drink clear liquids until 10:15 the morning of your surgery.   Clear liquids allowed are: Water, Non-Citrus Juices (without pulp), Carbonated Beverages, Clear Tea, [employer] Coffee ONLY (NO MILK, CREAM OR POWDERED CREAMER of any kind), and Gatorade    Take these medicines the morning of surgery with A SIP OF WATER:   escitalopram (LEXAPRO)   oxyCODONE (OXY IR/ROXICODONE) - as needed  As of today, STOP taking any Aspirin (unless otherwise instructed by your surgeon) Aleve, Naproxen, Ibuprofen, Motrin, Advil, Goody's, BC's, all herbal medications, fish oil, and all vitamins.   The day of surgery:          Do not wear jewelry. Do not wear lotions, powders, cologne or deodorant. Men may shave face and neck. Do not bring valuables to the hospital. Do not wear nail polish, gel polish, artificial nails, or any other type of covering on natural nails (fingers and toes) If you have artificial nails or gel coating that need to be removed by a nail salon, please have this removed prior to surgery. Artificial nails or gel coating may interfere with anesthesia's ability to adequately monitor your vital signs.  [facility] is not responsible for any belongings or valuables.    Do NOT Smoke (Tobacco/Vaping)  24 hours prior to your procedure  If you use a CPAP at night, you may bring your mask  for your overnight stay.   Contacts, glasses, hearing aids, dentures or partials may not be worn into surgery, please bring cases for these belongings   For patients admitted to the hospital, discharge time will be determined by your treatment team.   Patients discharged the day of surgery will not be allowed to drive home, and someone needs to stay with them for 24 hours.   SURGICAL WAITING ROOM VISITATION Patients having surgery or a procedure may have no more than 2 support people in the waiting area - these visitors may rotate.   Children under the age of 2 must have an adult with them who is not the patient. If the patient needs to stay at the hospital during part of their recovery, the visitor guidelines for inpatient rooms apply. Pre-op nurse will coordinate an appropriate time for 1 support person to accompany patient in pre-op.  This support person may not rotate.   Please refer to [email or web address] for the visitor guidelines for Inpatients (after your surgery is over and you are in a regular room).    Special instructions:    Oral Hygiene is also important to reduce your risk of infection.  Remember - BRUSH YOUR TEETH THE MORNING OF SURGERY WITH YOUR REGULAR TOOTHPASTE   [place]- Preparing For Surgery  Before surgery, you can play an important role. Because skin is not sterile, your skin needs to be as free of germs as possible. You can reduce the number  of germs on your skin by washing with CHG (chlorahexidine gluconate) Soap before surgery.  CHG is an antiseptic cleaner which kills germs and bonds with the skin to continue killing germs even after washing.     Please do not use if you have an allergy to CHG or antibacterial soaps. If your skin becomes reddened/irritated stop using the CHG.  Do not shave (including legs and underarms) for at least 48 hours prior to first CHG shower. It is OK to shave your face.  Please  follow these instructions carefully.     Shower the NIGHT BEFORE SURGERY and the MORNING OF SURGERY with CHG Soap.   If you chose to wash your hair, wash your hair first as usual with your normal shampoo. After you shampoo, rinse your hair and body thoroughly to remove the shampoo.  Then [employer] and genitals (private parts) with your normal soap and rinse thoroughly to remove soap.  After that Use CHG Soap as you would any other liquid soap. You can apply CHG directly to the skin and wash gently with a scrungie or a clean washcloth.   Apply the CHG Soap to your body ONLY FROM THE NECK DOWN.  Do not use on open wounds or open sores. Avoid contact with your eyes, ears, mouth and genitals (private parts). Wash Face and genitals (private parts)  with your normal soap.   Wash thoroughly, paying special attention to the area where your surgery will be performed.  Thoroughly rinse your body with warm water from the neck down.  DO NOT shower/wash with your normal soap after using and rinsing off the CHG Soap.  Pat yourself dry with a CLEAN TOWEL.  Wear CLEAN PAJAMAS to bed the night before surgery  Place CLEAN SHEETS on your bed the night before your surgery  DO NOT SLEEP WITH PETS.   Day of Surgery:  Take a shower with CHG soap. Wear Clean/Comfortable clothing the morning of surgery Do not apply any deodorants/lotions.   Remember to brush your teeth WITH YOUR REGULAR TOOTHPASTE.    If you received a COVID test during your pre-op visit, it is requested that you wear a mask when out in public, stay away from anyone that may not be feeling well, and notify your surgeon if you develop symptoms. If you have been in contact with anyone that has tested positive in the last 10 days, please notify your surgeon.    Please read over the following fact sheets that you were given.

## 2023-02-20 ENCOUNTER — Encounter (HOSPITAL_COMMUNITY)
Admission: RE | Admit: 2023-02-20 | Discharge: 2023-02-20 | Disposition: A | Discharge status: Home / Self Care | Payer: Medicare Other | Source: Ambulatory Visit | Attending: Orthopaedic Surgery | Admitting: Orthopaedic Surgery

## 2023-02-20 ENCOUNTER — Other Ambulatory Visit: Payer: Self-pay

## 2023-02-20 ENCOUNTER — Encounter (HOSPITAL_COMMUNITY): Payer: Self-pay

## 2023-02-20 VITALS — BP 138/74 | HR 71 | Temp 98.3°F | Resp 18 | Ht 67.0 in | Wt 185.0 lb

## 2023-02-20 DIAGNOSIS — Z01818 Encounter for other preprocedural examination: Secondary | ICD-10-CM | POA: Diagnosis not present

## 2023-02-20 HISTORY — DX: Family history of other specified conditions: Z84.89

## 2023-02-20 HISTORY — DX: Prediabetes: R73.03

## 2023-02-20 LAB — BASIC METABOLIC PANEL
Anion gap: 8 (ref 5–15)
BUN: 21 mg/dL (ref 8–23)
CO2: 25 mmol/L (ref 22–32)
Calcium: 9 mg/dL (ref 8.9–10.3)
Chloride: 102 mmol/L (ref 98–111)
Creatinine, Ser: 1.03 mg/dL (ref 0.61–1.24)
GFR, Estimated: >60 mL/min (ref >60)
Glucose, Bld: 119 mg/dL — ABNORMAL HIGH (ref 70–99)
Potassium: 4.3 mmol/L (ref 3.5–5.1)
Sodium: 135 mmol/L (ref 135–145)

## 2023-02-20 LAB — CBC
HCT: 41.3 % (ref 39.0–52.0)
Hemoglobin: 13.5 g/dL (ref 13.0–17.0)
MCH: 28.8 pg (ref 26.0–34.0)
MCHC: 32.7 g/dL (ref 30.0–36.0)
MCV: 88.1 fL (ref 80.0–100.0)
Platelets: 245 10*3/uL (ref 150–400)
RBC: 4.69 MIL/uL (ref 4.22–5.81)
RDW: 13.6 % (ref 11.5–15.5)
WBC: 7.3 10*3/uL (ref 4.0–10.5)
nRBC: 0 % (ref 0.0–0.2)

## 2023-02-20 NOTE — Progress Notes (Addendum)
PCP - Vanice Sarah Cardiologist - denies  PPM/ICD - denies Device Orders -  Rep Notified -   Chest x-ray - na EKG - na Stress Test - denies ECHO - denies Cardiac Cath - denies  Sleep Study - home sleep study-10/03/20? CPAP - yes  Fasting Blood Sugar - na Checks Blood Sugar _____ times a day  Last dose of GLP1 agonist-  na GLP1 instructions: na  Blood Thinner Instructions:na Aspirin Instructions:na  ERAS Protcol -clears until 1015 PRE-SURGERY Ensure or G2- no  COVID TEST- na   Anesthesia review: no  Patient denies shortness of breath, fever, cough and chest pain at PAT appointment   All instructions explained to the patient, with a verbal understanding of the material. Patient agrees to go over the instructions while at home for a better understanding. Patient also instructed to wear a mask when out in public prior to surgery. The opportunity to ask questions was provided.

## 2023-02-25 NOTE — Progress Notes (Signed)
Patient was called to inform that the surgery time for tomorrow was changed to 07:30 o'clock. Patient was instructed to be at the hospital at 05:30 o'clock and stop drinking clear liquids at 04:30 o'clock. Patient verbalized understanding.

## 2023-02-26 ENCOUNTER — Ambulatory Visit (HOSPITAL_COMMUNITY): Payer: Medicare Other | Admitting: Anesthesiology

## 2023-02-26 ENCOUNTER — Other Ambulatory Visit: Payer: Self-pay

## 2023-02-26 ENCOUNTER — Encounter (HOSPITAL_COMMUNITY)
Admission: RE | Disposition: A | Discharge status: Home / Self Care | Payer: Self-pay | Source: Home / Self Care | Attending: Orthopaedic Surgery

## 2023-02-26 ENCOUNTER — Ambulatory Visit (HOSPITAL_BASED_OUTPATIENT_CLINIC_OR_DEPARTMENT_OTHER): Payer: Medicare Other | Admitting: Anesthesiology

## 2023-02-26 ENCOUNTER — Ambulatory Visit (HOSPITAL_COMMUNITY): Payer: Medicare Other

## 2023-02-26 ENCOUNTER — Encounter (HOSPITAL_COMMUNITY): Payer: Self-pay | Admitting: Orthopaedic Surgery

## 2023-02-26 ENCOUNTER — Ambulatory Visit (HOSPITAL_COMMUNITY)
Admission: RE | Admit: 2023-02-26 | Discharge: 2023-02-26 | Disposition: A | Discharge status: Home / Self Care | Payer: Medicare Other | Attending: Orthopaedic Surgery | Admitting: Orthopaedic Surgery

## 2023-02-26 DIAGNOSIS — G4733 Obstructive sleep apnea (adult) (pediatric): Secondary | ICD-10-CM | POA: Insufficient documentation

## 2023-02-26 DIAGNOSIS — F32A Depression, unspecified: Secondary | ICD-10-CM | POA: Diagnosis not present

## 2023-02-26 DIAGNOSIS — F418 Other specified anxiety disorders: Secondary | ICD-10-CM

## 2023-02-26 DIAGNOSIS — G4730 Sleep apnea, unspecified: Secondary | ICD-10-CM | POA: Diagnosis not present

## 2023-02-26 DIAGNOSIS — S96811A Strain of other specified muscles and tendons at ankle and foot level, right foot, initial encounter: Secondary | ICD-10-CM | POA: Insufficient documentation

## 2023-02-26 DIAGNOSIS — F419 Anxiety disorder, unspecified: Secondary | ICD-10-CM | POA: Insufficient documentation

## 2023-02-26 DIAGNOSIS — W1789XA Other fall from one level to another, initial encounter: Secondary | ICD-10-CM | POA: Diagnosis not present

## 2023-02-26 DIAGNOSIS — S92061A Displaced intraarticular fracture of right calcaneus, initial encounter for closed fracture: Secondary | ICD-10-CM | POA: Insufficient documentation

## 2023-02-26 DIAGNOSIS — Z79899 Other long term (current) drug therapy: Secondary | ICD-10-CM | POA: Diagnosis not present

## 2023-02-26 HISTORY — PX: OPEN REDUCTION, INTERNAL FIXATION (ORIF) CALCANEAL FRACTURE WITH FUSION: SHX5994

## 2023-02-26 LAB — SURGICAL PCR SCREEN
MRSA, PCR: NEGATIVE
Staphylococcus aureus: NEGATIVE

## 2023-02-26 SURGERY — OPEN REDUCTION, INTERNAL FIXATION (ORIF) CALCANEAL FRACTURE WITH FUSION
Anesthesia: General | Site: Foot | Laterality: Right

## 2023-02-26 MED ORDER — PROPOFOL 10 MG/ML IV BOLUS
INTRAVENOUS | Status: AC
  Filled 2023-02-26: qty 20

## 2023-02-26 MED ORDER — ROPIVACAINE HCL 5 MG/ML IJ SOLN
INTRAMUSCULAR | Status: DC | PRN
  Administered 2023-02-26: 25 mL via PERINEURAL
  Administered 2023-02-26: 15 mL via PERINEURAL

## 2023-02-26 MED ORDER — LIDOCAINE 2% (20 MG/ML) 5 ML SYRINGE
INTRAMUSCULAR | Status: AC
  Filled 2023-02-26: qty 5

## 2023-02-26 MED ORDER — OXYCODONE HCL 5 MG PO TABS: ORAL_TABLET | ORAL | 0 refills | Status: AC

## 2023-02-26 MED ORDER — OXYCODONE HCL 5 MG/5ML PO SOLN: 5.0000 mg | Freq: Once | ORAL | Status: DC | PRN

## 2023-02-26 MED ORDER — DEXAMETHASONE SODIUM PHOSPHATE 10 MG/ML IJ SOLN
INTRAMUSCULAR | Status: DC | PRN
  Administered 2023-02-26: 10 mg via INTRAVENOUS

## 2023-02-26 MED ORDER — VANCOMYCIN HCL 500 MG IV SOLR
INTRAVENOUS | Status: AC
  Filled 2023-02-26: qty 10

## 2023-02-26 MED ORDER — FENTANYL CITRATE (PF) 250 MCG/5ML IJ SOLN
INTRAMUSCULAR | Status: DC | PRN
  Administered 2023-02-26 (×3): 50 ug via INTRAVENOUS

## 2023-02-26 MED ORDER — CHLORHEXIDINE GLUCONATE 0.12 % MT SOLN
15.0000 mL | Freq: Once | OROMUCOSAL | Status: AC
  Administered 2023-02-26: 15 mL via OROMUCOSAL
  Filled 2023-02-26: qty 15

## 2023-02-26 MED ORDER — EPHEDRINE SULFATE-NACL 50-0.9 MG/10ML-% IV SOSY
PREFILLED_SYRINGE | INTRAVENOUS | Status: DC | PRN
  Administered 2023-02-26: 5 mg via INTRAVENOUS
  Administered 2023-02-26: 10 mg via INTRAVENOUS

## 2023-02-26 MED ORDER — PHENYLEPHRINE HCL-NACL 20-0.9 MG/250ML-% IV SOLN
INTRAVENOUS | Status: DC | PRN
  Administered 2023-02-26: 15 ug/min via INTRAVENOUS

## 2023-02-26 MED ORDER — 0.9 % SODIUM CHLORIDE (POUR BTL) OPTIME
TOPICAL | Status: DC | PRN
  Administered 2023-02-26: 1000 mL

## 2023-02-26 MED ORDER — FENTANYL CITRATE (PF) 250 MCG/5ML IJ SOLN
INTRAMUSCULAR | Status: AC
  Filled 2023-02-26: qty 5

## 2023-02-26 MED ORDER — MIDAZOLAM HCL 2 MG/2ML IJ SOLN
INTRAMUSCULAR | Status: AC
  Filled 2023-02-26: qty 2

## 2023-02-26 MED ORDER — FENTANYL CITRATE (PF) 100 MCG/2ML IJ SOLN: 25.0000 ug | INTRAMUSCULAR | Status: DC | PRN

## 2023-02-26 MED ORDER — SUGAMMADEX SODIUM 200 MG/2ML IV SOLN
INTRAVENOUS | Status: DC | PRN
  Administered 2023-02-26: 200 mg via INTRAVENOUS

## 2023-02-26 MED ORDER — LACTATED RINGERS IV SOLN: INTRAVENOUS | Status: DC

## 2023-02-26 MED ORDER — ASPIRIN 325 MG PO TABS: ORAL_TABLET | ORAL | 0 refills | Status: AC

## 2023-02-26 MED ORDER — PROPOFOL 10 MG/ML IV BOLUS: INTRAVENOUS | Status: DC | PRN

## 2023-02-26 MED ORDER — ONDANSETRON HCL 4 MG/2ML IJ SOLN
INTRAMUSCULAR | Status: DC | PRN
  Administered 2023-02-26: 4 mg via INTRAVENOUS

## 2023-02-26 MED ORDER — ORAL CARE MOUTH RINSE: 15.0000 mL | Freq: Once | OROMUCOSAL | Status: AC

## 2023-02-26 MED ORDER — PHENYLEPHRINE 80 MCG/ML (10ML) SYRINGE FOR IV PUSH (FOR BLOOD PRESSURE SUPPORT)
PREFILLED_SYRINGE | INTRAVENOUS | Status: DC | PRN
  Administered 2023-02-26: 240 ug via INTRAVENOUS
  Administered 2023-02-26: 80 ug via INTRAVENOUS
  Administered 2023-02-26: 160 ug via INTRAVENOUS
  Administered 2023-02-26 (×2): 80 ug via INTRAVENOUS

## 2023-02-26 MED ORDER — DEXAMETHASONE SODIUM PHOSPHATE 10 MG/ML IJ SOLN
INTRAMUSCULAR | Status: AC
  Filled 2023-02-26: qty 1

## 2023-02-26 MED ORDER — ONDANSETRON HCL 4 MG/2ML IJ SOLN: 4.0000 mg | Freq: Four times a day (QID) | INTRAMUSCULAR | Status: DC | PRN

## 2023-02-26 MED ORDER — CEFAZOLIN SODIUM-DEXTROSE 2-4 GM/100ML-% IV SOLN
2.0000 g | INTRAVENOUS | Status: AC
  Administered 2023-02-26: 2 g via INTRAVENOUS
  Filled 2023-02-26: qty 100

## 2023-02-26 MED ORDER — PROPOFOL 10 MG/ML IV BOLUS
INTRAVENOUS | Status: DC | PRN
  Administered 2023-02-26: 50 mg via INTRAVENOUS
  Administered 2023-02-26: 200 mg via INTRAVENOUS

## 2023-02-26 MED ORDER — ONDANSETRON HCL 4 MG/2ML IJ SOLN
INTRAMUSCULAR | Status: AC
  Filled 2023-02-26: qty 2

## 2023-02-26 MED ORDER — ROCURONIUM BROMIDE 10 MG/ML (PF) SYRINGE
PREFILLED_SYRINGE | INTRAVENOUS | Status: DC | PRN
  Administered 2023-02-26: 50 mg via INTRAVENOUS
  Administered 2023-02-26: 30 mg via INTRAVENOUS

## 2023-02-26 MED ORDER — OXYCODONE HCL 5 MG PO TABS: 5.0000 mg | ORAL_TABLET | Freq: Once | ORAL | Status: DC | PRN

## 2023-02-26 MED ORDER — LIDOCAINE 2% (20 MG/ML) 5 ML SYRINGE
INTRAMUSCULAR | Status: DC | PRN
  Administered 2023-02-26: 60 mg via INTRAVENOUS

## 2023-02-26 MED ORDER — MIDAZOLAM HCL 2 MG/2ML IJ SOLN
INTRAMUSCULAR | Status: DC | PRN
  Administered 2023-02-26 (×2): 1 mg via INTRAVENOUS

## 2023-02-26 SURGICAL SUPPLY — 67 items
APL PRP STRL LF DISP 70% ISPRP (MISCELLANEOUS) ×2
APL SKNCLS STERI-STRIP NONHPOA (GAUZE/BANDAGES/DRESSINGS)
BAG COUNTER SPONGE SURGICOUNT (BAG) IMPLANT
BAG SPNG CNTER NS LX DISP (BAG)
BANDAGE ESMARK 6X9 LF (GAUZE/BANDAGES/DRESSINGS) IMPLANT
BENZOIN TINCTURE PRP APPL 2/3 (GAUZE/BANDAGES/DRESSINGS) IMPLANT
BIT DRILL 2.9 CANN QC NONSTRL (BIT) IMPLANT
BIT DRILL CALIBRATED 2.7 (BIT) IMPLANT
BLADE SURG 15 STRL LF DISP TIS (BLADE) ×2 IMPLANT
BLADE SURG 15 STRL SS (BLADE) ×2
BNDG CMPR 5X4 KNIT ELC UNQ LF (GAUZE/BANDAGES/DRESSINGS) ×1
BNDG CMPR 5X62 HK CLSR LF (GAUZE/BANDAGES/DRESSINGS) ×1
BNDG CMPR 9X6 STRL LF SNTH (GAUZE/BANDAGES/DRESSINGS)
BNDG CMPR MED 10X6 ELC LF (GAUZE/BANDAGES/DRESSINGS)
BNDG ELASTIC 4INX 5YD STR LF (GAUZE/BANDAGES/DRESSINGS) IMPLANT
BNDG ELASTIC 6INX 5YD STR LF (GAUZE/BANDAGES/DRESSINGS) IMPLANT
BNDG ELASTIC 6X10 VLCR STRL LF (GAUZE/BANDAGES/DRESSINGS) ×1 IMPLANT
BNDG ESMARK 6X9 LF (GAUZE/BANDAGES/DRESSINGS)
CHLORAPREP W/TINT 26 (MISCELLANEOUS) ×1 IMPLANT
CUFF TOURN SGL QUICK 34 (TOURNIQUET CUFF) ×1
CUFF TRNQT CYL 34X4.125X (TOURNIQUET CUFF) ×1 IMPLANT
DRAPE C-ARM 42X72 X-RAY (DRAPES) ×1 IMPLANT
DRAPE C-ARMOR (DRAPES) ×1 IMPLANT
DRAPE EXTREMITY T 121X128X90 (DISPOSABLE) ×1 IMPLANT
DRAPE IMP U-DRAPE 54X76 (DRAPES) ×1 IMPLANT
DRAPE U-SHAPE 47X51 STRL (DRAPES) ×1 IMPLANT
DRSG XEROFORM 1X8 (GAUZE/BANDAGES/DRESSINGS) IMPLANT
ELECT REM PT RETURN 9FT ADLT (ELECTROSURGICAL) ×1
ELECTRODE REM PT RTRN 9FT ADLT (ELECTROSURGICAL) ×1 IMPLANT
GAUZE PAD ABD 8X10 STRL (GAUZE/BANDAGES/DRESSINGS) IMPLANT
GAUZE SPONGE 4X4 12PLY STRL (GAUZE/BANDAGES/DRESSINGS) ×1 IMPLANT
GAUZE SPONGE 4X4 12PLY STRL LF (GAUZE/BANDAGES/DRESSINGS) ×1 IMPLANT
GLOVE BIOGEL M STRL SZ7.5 (GLOVE) ×1 IMPLANT
GLOVE BIOGEL PI IND STRL 8 (GLOVE) ×1 IMPLANT
GOWN STRL REUS W/ TWL LRG LVL3 (GOWN DISPOSABLE) ×1 IMPLANT
GOWN STRL REUS W/ TWL XL LVL3 (GOWN DISPOSABLE) ×1 IMPLANT
GOWN STRL REUS W/TWL LRG LVL3 (GOWN DISPOSABLE) ×1
GOWN STRL REUS W/TWL XL LVL3 (GOWN DISPOSABLE) ×1
K-WIRE ACE 1.6X6 (WIRE) ×5
KIT BASIN OR (CUSTOM PROCEDURE TRAY) ×1 IMPLANT
KWIRE ACE 1.6X6 (WIRE) IMPLANT
NS IRRIG 1000ML POUR BTL (IV SOLUTION) ×1 IMPLANT
PACK ORTHO EXTREMITY (CUSTOM PROCEDURE TRAY) ×1 IMPLANT
PAD CAST 4YDX4 CTTN HI CHSV (CAST SUPPLIES) ×1 IMPLANT
PADDING CAST COTTON 4X4 STRL (CAST SUPPLIES) ×2
PADDING CAST SYNTHETIC 4X4 STR (CAST SUPPLIES) ×1 IMPLANT
PLATE LOCK CALC MIS 2H LG RT (Plate) IMPLANT
SCREW ACE CAN 4.0 48M (Screw) IMPLANT
SCREW LOCK CORT STAR 3.5X24 (Screw) IMPLANT
SCREW LOCK CORT STAR 3.5X26 (Screw) IMPLANT
SCREW LOCK CORT STAR 3.5X30 (Screw) IMPLANT
SCREW LOCK CORT STAR 3.5X34 (Screw) IMPLANT
SCREW LOCK CORT STAR 3.5X38 (Screw) IMPLANT
SCREW T15 LP CORT 3.5X38MM NS (Screw) IMPLANT
SPIKE FLUID TRANSFER (MISCELLANEOUS) IMPLANT
SPLINT PLASTER CAST XFAST 5X30 (CAST SUPPLIES) IMPLANT
SPONGE T-LAP 18X18 ~~LOC~~+RFID (SPONGE) IMPLANT
STRIP CLOSURE SKIN 1/2X4 (GAUZE/BANDAGES/DRESSINGS) IMPLANT
SUCTION TUBE FRAZIER 10FR DISP (SUCTIONS) ×1 IMPLANT
SUT ETHILON 3 0 PS 1 (SUTURE) ×1 IMPLANT
SUT MNCRL AB 3-0 PS2 18 (SUTURE) ×1 IMPLANT
SUT PDS AB 2-0 CT2 27 (SUTURE) ×1 IMPLANT
SUT VIC AB 0 CT1 27 (SUTURE)
SUT VIC AB 0 CT1 27XBRD ANBCTR (SUTURE) IMPLANT
TOWEL GREEN STERILE FF (TOWEL DISPOSABLE) ×2 IMPLANT
TUBE CONNECTING 20X1/4 (TUBING) ×2 IMPLANT
UNDERPAD 30X36 HEAVY ABSORB (UNDERPADS AND DIAPERS) ×1 IMPLANT

## 2023-02-26 NOTE — Anesthesia Procedure Notes (Signed)
Anesthesia Regional Block: Popliteal block   Pre-Anesthetic Checklist: , timeout performed,  Correct Patient, Correct Site, Correct Laterality,  Correct Procedure, Correct Position, site marked,  Risks and benefits discussed,  Surgical consent,  Pre-op evaluation,  At surgeon's request and post-op pain management  Laterality: Right  Prep: chloraprep       Needles:  Injection technique: Single-shot  Needle Type: Echogenic Stimulator Needle          Additional Needles:   Procedures:, nerve stimulator,,,,,     Nerve Stimulator or Paresthesia:  Response: plantar flexion of foot, 0.45 mA  Additional Responses:   Narrative:  Start time: 02/26/2023 7:10 AM End time: 02/26/2023 7:17 AM Injection made incrementally with aspirations every 5 mL.  Performed by: Personally  Anesthesiologist: Achille Rich, MD  Additional Notes: Functioning IV was confirmed and monitors were applied.  A 90mm 21ga Arrow echogenic stimulator needle was used. Sterile prep and drape,hand hygiene and sterile gloves were used.  Negative aspiration and negative test dose prior to incremental administration of local anesthetic. The patient tolerated the procedure well.  Ultrasound guidance: relevent anatomy identified, needle position confirmed, local anesthetic spread visualized around nerve(s), vascular puncture avoided.  Image printed for medical record.

## 2023-02-26 NOTE — H&P (Signed)
PREOPERATIVE H&P  Chief Complaint: Right calcaneus fracture  HPI: Tom Webb is a 67 y.o. male who presents for preoperative history and physical with a diagnosis of right calcaneus fracture.  He sustained this after a fall from height.  He has significant displacement and comminution into the joint.  He is indicated for surgery and is here today for surgery.. Symptoms are rated as moderate to severe, and have been worsening.  This is significantly impairing activities of daily living.  He has elected for surgical management.   Past Medical History:  Diagnosis Date   Anxiety    Depression    Family history of adverse reaction to anesthesia    Mother had unknown difficulty with anesthesia per patient   Headache    Insomnia    OSA (obstructive sleep apnea)    Pre-diabetes    Vision abnormalities    Past Surgical History:  Procedure Laterality Date   DENTAL SURGERY     VASECTOMY     Social History   Socioeconomic History   Marital status: Married    Spouse name: Not on file   Number of children: Not on file   Years of education: Not on file   Highest education level: Not on file  Occupational History   Not on file  Tobacco Use   Smoking status: Never   Smokeless tobacco: Never  Vaping Use   Vaping Use: Never used  Substance and Sexual Activity   Alcohol use: Yes    Comment: rare   Drug use: Not on file   Sexual activity: Not on file  Other Topics Concern   Not on file  Social History Narrative   Not on file   Social Determinants of Health   Financial Resource Strain: Not on file  Food Insecurity: Not on file  Transportation Needs: Not on file  Physical Activity: Not on file  Stress: Not on file  Social Connections: Not on file   Family History  Problem Relation Age of Onset   Tremor Mother    Syncope episode Father    Cancer Paternal Grandfather    Angioedema Sister    Angioedema Brother    No Known Allergies Prior to Admission medications    Medication Sig Start Date End Date Taking? Authorizing Provider  escitalopram (LEXAPRO) 20 MG tablet Take 20 mg by mouth daily. 12/20/15  Yes [provider]  ibuprofen (ADVIL) 200 MG tablet Take 200 mg by mouth every 6 (six) hours as needed for moderate pain.   Yes [provider]  Misc Natural Products (FOCUSED MIND PO) Take 1 tablet by mouth 2 (two) times daily.   Yes [provider]  Multiple Vitamins-Minerals (CENTRUM SILVER ADULT 50+) TABS Take 1 tablet by mouth daily.   Yes [provider]  NON FORMULARY Pt uses a cpap nightly   Yes [provider]  oxyCODONE (OXY IR/ROXICODONE) 5 MG immediate release tablet Take 5 mg by mouth every 6 (six) hours as needed for moderate pain. 02/10/23  Yes [provider]  HYDROcodone-acetaminophen (NORCO/VICODIN) 5-325 MG tablet Take 1 tablet by mouth every 4 (four) hours as needed. Patient not taking: Reported on 02/18/2023 08/21/21   Jeannie Fend, PA-C  meloxicam (MOBIC) 15 MG tablet Take 1 tablet (15 mg total) by mouth daily. Patient not taking: Reported on 02/18/2023 02/12/16   Ernestene Kiel T, DPM  oxyCODONE (ROXICODONE) 5 MG immediate release tablet Take 1-2 tablets every 6 hours for severe pain Patient not taking:  Reported on 02/18/2023 02/11/23   Darrick Grinder, PA-C     Positive ROS: All other systems have been reviewed and were otherwise negative with the exception of those mentioned in the HPI and as above.  Physical Exam:  Vitals:   02/26/23 0613  BP: (!) 127/102  Pulse: 66  Resp: 18  Temp: 97.8 F (36.6 C)  SpO2: 95%   General: Alert, no acute distress Cardiovascular: No pedal edema Respiratory: No cyanosis, no use of accessory musculature GI: No organomegaly, abdomen is soft and non-tender Skin: No lesions in the area of chief complaint Neurologic: Sensation intact distally Psychiatric: Patient is competent for consent with normal mood and affect Lymphatic: No axillary or  cervical lymphadenopathy  MUSCULOSKELETAL: Right foot in a short leg splint.  There is swelling of the forefoot.  No tenderness proximal to the splint.  Sensation grossly intact about the toes.  Assessment: Right displaced intra-articular calcaneus fracture   Plan: Plan for open treatment of right calcaneus fracture.  We discussed the risks, benefits and alternatives of surgery which include but are not limited to wound healing complications, infection, nonunion, malunion, need for further surgery, damage to surrounding structures and continued pain.  They understand there is no guarantees to an acceptable outcome.  After weighing these risks they opted to proceed with surgery.     Terance Hart, MD    02/26/2023 6:35 AM

## 2023-02-26 NOTE — Anesthesia Procedure Notes (Signed)
Anesthesia Regional Block: Adductor canal block   Pre-Anesthetic Checklist: , timeout performed,  Correct Patient, Correct Site, Correct Laterality,  Correct Procedure, Correct Position, site marked,  Risks and benefits discussed,  Surgical consent,  Pre-op evaluation,  At surgeon's request and post-op pain management  Laterality: Right  Prep: chloraprep       Needles:  Injection technique: Single-shot  Needle Type: Echogenic Needle     Needle Length: 9cm  Needle Gauge: 21     Additional Needles:   Narrative:  Start time: 02/26/2023 7:18 AM End time: 02/26/2023 7:24 AM Injection made incrementally with aspirations every 5 mL.  Performed by: Personally  Anesthesiologist: Achille Rich, MD  Additional Notes: Pt tolerated the procedure well.

## 2023-02-26 NOTE — Anesthesia Preprocedure Evaluation (Signed)
Anesthesia Evaluation  Patient identified by MRN, date of birth, ID band Patient awake    Reviewed: Allergy & Precautions, H&P , NPO status , Patient's Chart, lab work & pertinent test results  Airway Mallampati: II   Neck ROM: full    Dental   Pulmonary sleep apnea    breath sounds clear to auscultation       Cardiovascular negative cardio ROS  Rhythm:regular Rate:Normal     Neuro/Psych  Headaches PSYCHIATRIC DISORDERS Anxiety Depression       GI/Hepatic   Endo/Other    Renal/GU      Musculoskeletal   Abdominal   Peds  Hematology   Anesthesia Other Findings   Reproductive/Obstetrics                             Anesthesia Physical Anesthesia Plan  ASA: 2  Anesthesia Plan: General   Post-op Pain Management: Regional block*   Induction: Intravenous  PONV Risk Score and Plan: 2 and Ondansetron, Dexamethasone, Midazolam and Treatment may vary due to age or medical condition  Airway Management Planned: Oral ETT  Additional Equipment:   Intra-op Plan:   Post-operative Plan: Extubation in OR  Informed Consent: I have reviewed the patients History and Physical, chart, labs and discussed the procedure including the risks, benefits and alternatives for the proposed anesthesia with the patient or authorized representative who has indicated his/her understanding and acceptance.     Dental advisory given  Plan Discussed with: CRNA, Anesthesiologist and Surgeon  Anesthesia Plan Comments:        Anesthesia Quick Evaluation

## 2023-02-26 NOTE — Anesthesia Procedure Notes (Signed)
Procedure Name: Intubation Date/Time: 02/26/2023 8:00 AM  Performed by: Orlin Hilding, CRNAPre-anesthesia Checklist: Patient identified, Emergency Drugs available, Suction available, Patient being monitored and Timeout performed Patient Re-evaluated:Patient Re-evaluated prior to induction Oxygen Delivery Method: Circle system utilized Preoxygenation: Pre-oxygenation with 100% oxygen Induction Type: IV induction Ventilation: Mask ventilation without difficulty and Oral airway inserted - appropriate to patient size Laryngoscope Size: Glidescope and 3 Grade View: Grade I Tube type: Oral Tube size: 7.5 mm Number of attempts: 1 Placement Confirmation: ETT inserted through vocal cords under direct vision, positive ETCO2 and breath sounds checked- equal and bilateral Secured at: 23 cm Tube secured with: Tape Dental Injury: Teeth and Oropharynx as per pre-operative assessment  Comments: After placement of ETT tidal volumes not maintained  Dr Chaney Malling notified ETT balloon not holding air.  Patient reintubated by Dr Chaney Malling with 7.0 ETT successfully  tidal volumes maintained

## 2023-02-26 NOTE — Op Note (Signed)
Tom Webb male 67 y.o. 02/26/2023  PreOperative Diagnosis: Right displaced intra-articular calcaneus fracture  PostOperative Diagnosis: Right displaced intra-articular calcaneus fracture Peroneus longus tendon tear  PROCEDURE: Open reduction internal fixation of right calcaneus Right peroneus longus tendon repair  SURGEON: Dub Mikes, MD  ASSISTANT: Jesse [place], PA-C was necessary for patient positioning, prep, drape, assistance with fracture reduction and placement of hardware  ANESTHESIA: General endotracheal tube with peripheral nerve blockade  FINDINGS: See below  IMPLANTS: [employer] calcaneal fixation plate  [name]:67 y.o. male sustained the above injury after a fall.  He had significant displacement and depression of the posterior facet of the calcaneus with comminution and intra-articular extension.  Given his findings he was indicated for surgery.   Patient understood the risks, benefits and alternatives to surgery which include but are not limited to wound healing complications, infection, nonunion, malunion, need for further surgery as well as damage to surrounding structures. They also understood the potential for continued pain in that there were no guarantees of acceptable outcome After weighing these risks the patient opted to proceed with surgery.  PROCEDURE: Patient was identified in the preoperative holding area.  The right leg was marked by myself.  Consent was signed by myself and the patient.  Block was performed by anesthesia in the preoperative holding area.  Patient was taken to the operative suite and placed supine on the operative table.  General endotracheal tube anesthesia was induced without difficulty.  Tourniquet was placed on the operative thigh.  The patient was then placed in the lateral decubitus position on a beanbag.  Bony prominences were well-padded.  A bolster was made for the right leg distally.  Preoperative antibiotics  were given. The extremity was prepped and draped in the usual sterile fashion and surgical timeout was performed.  The limb was elevated and the tourniquet was inflated to 250 mmHg.  We began by making a longitudinal incision overlying the sinus Tarsi region.  It was from the tip of the fibula down to the course of the fourth ray.  Incision was carried sharply through skin and subcutaneous tissue and skin flaps were created.  We then were able to gain access to the peroneal tendon sheath and this was incised in line with the peroneal tendons opening up.  It was noted that there was a tear of the peroneus longus tendon along the course of the lateral wall of the calcaneus consistent with the injury.  A Glorious Peach was placed within the area of the superior peroneal retinaculum and tested competence and was found to be competent.  We then proceeded to make an incision within the capsular tissue for the sinus Tarsi to gain access to the calcaneus fracture.  The soft tissue was elevated off the lateral wall of the calcaneus to gain access there as well as fibrous tissue and hematoma tissue was removed from the subtalar joint.  There is significant depression of the posterior facet and lateral wall blowout.  There was some extension of the fracture site into the anterior process of the calcaneus.  All the fracture fragments were fully mobilized.  Then using a key elevator and a Therapist, nutritional the posterior facet piece that was significantly depressed was reduced under direct visualization.  There was a good read with the medial aspect of the posterior facet.  Once it was adequately reduced K wire fixation was used to this stabilized the fracture provisionally.  We then turned attention to the anterior process of the calcaneus.  There was some continued displacement from the dorsal fracture fragment this was reduced under direct visualization and again held provisionally with K wire fixation.  Fluoroscopy was then  used to gauge acceptable reduction.  It was found that the posterior tuberosity piece had reduced nicely and the subtalar joint was well reduced.  The anterior process likely definitely reduced.  We then chose the correct size plate and was checked to be positioned appropriately on the right side as intraoperative fluoroscopy.  We then proceeded to place a combination of locking and nonlocking screws starting with the subchondral bone of the posterior facet using nonlocking and locking screws to stabilize the area and raft the joint.  Then using percutaneous screw placement for the posterior tuberosity pieces we are able to capture the fracture fragments well.  There was a fracture through the tuberosity that was 10 type fracture and therefore a small stab incision was made lateral to the Achilles tendon and using a 4 oh cannulated screw a screw was placed across the tuberosity fragment.  Fluoroscopy confirmed appropriate reduction and hardware placement.  Final fluoroscopic images were obtained.  Then the wound was irrigated.  Using a 2-0 Vicryl suture we proceeded with repair of the peroneus longus tendon.  Repair was done in a running baseball stitch technique to repair the splint of the tendon.  This was done without difficulty.  After repair the tendon was able to glide well through the sheath and through the SPR.  There was no obvious tearing of the peroneus brevis tendon.  Then the wounds were irrigated with normal saline.  The wounds were closed in layered fashion using 2-0 Vicryl, 3-0 Monocryl and 3-0 nylon suture.  Soft dressing was placed.  Tourniquet was released.  Short leg splint was placed.  He was awakened from anesthesia and taken recovery in stable condition.  No complications.   POST OPERATIVE INSTRUCTIONS: Nonweightbearing to operative extremity Keep splint in place Follow-up in 2 weeks for splint removal, suture removal if appropriate nonweightbearing x-rays of the right  foot  TOURNIQUET TIME: Less than 2 hours  BLOOD LOSS:  Minimal         DRAINS: none         SPECIMEN: none       COMPLICATIONS:  * No complications entered in OR log *         Disposition: PACU - hemodynamically stable.         Condition: stable

## 2023-02-26 NOTE — Discharge Instructions (Signed)

## 2023-02-26 NOTE — Transfer of Care (Signed)
Immediate Anesthesia Transfer of Care Note  Patient: Tom Webb [facility]  Procedure(s) Performed: OPEN TREATMENT OF RIGHT CALCANEUS FRACTURE (Right: Foot)  Patient Location: PACU  Anesthesia Type:GA combined with regional for post-op pain  Level of Consciousness: awake and patient cooperative  Airway & Oxygen Therapy: Patient Spontanous Breathing and Patient connected to face mask oxygen  Post-op Assessment: Report given to RN and Post -op Vital signs reviewed and stable  Post vital signs: Reviewed and stable  Last Vitals:  Vitals Value Taken Time  BP 112/71 02/26/23 1001  Temp 36.6 C 02/26/23 1000  Pulse 74 02/26/23 1006  Resp 16 02/26/23 1006  SpO2 97 % 02/26/23 1006  Vitals shown include unvalidated device data.  Last Pain:  Vitals:   02/26/23 0646  TempSrc:   PainSc: 4       Patients Stated Pain Goal: 2 (02/26/23 2952)  Complications: No notable events documented.

## 2023-02-27 NOTE — Anesthesia Postprocedure Evaluation (Signed)
Anesthesia Post Note  Patient: Tom Webb [facility]  Procedure(s) Performed: OPEN TREATMENT OF RIGHT CALCANEUS FRACTURE (Right: Foot)     Patient location during evaluation: PACU Anesthesia Type: General and Regional Level of consciousness: awake and alert Pain management: pain level controlled Vital Signs Assessment: post-procedure vital signs reviewed and stable Respiratory status: spontaneous breathing, nonlabored ventilation, respiratory function stable and patient connected to nasal cannula oxygen Cardiovascular status: blood pressure returned to baseline and stable Postop Assessment: no apparent nausea or vomiting Anesthetic complications: no   No notable events documented.  Last Vitals:  Vitals:   02/26/23 1015 02/26/23 1030  BP: 114/75 123/75  Pulse: 73 68  Resp: (!) 21 16  Temp:  36.6 C  SpO2: 96% 97%    Last Pain:  Vitals:   02/26/23 1030  TempSrc:   PainSc: 0-No pain                 Camran Keady S

## 2023-08-09 IMAGING — DX DG FINGER INDEX 2+V*L*
1 series · 3 of 3 positions shown · non-contrast
Comparison: None.

CLINICAL DATA: Left index finger power saw injury.

EXAM:
LEFT INDEX FINGER 2+V

[Series 1: finger · 0.14mm/px · 3 of 3 slices shown]
[im 1/3]
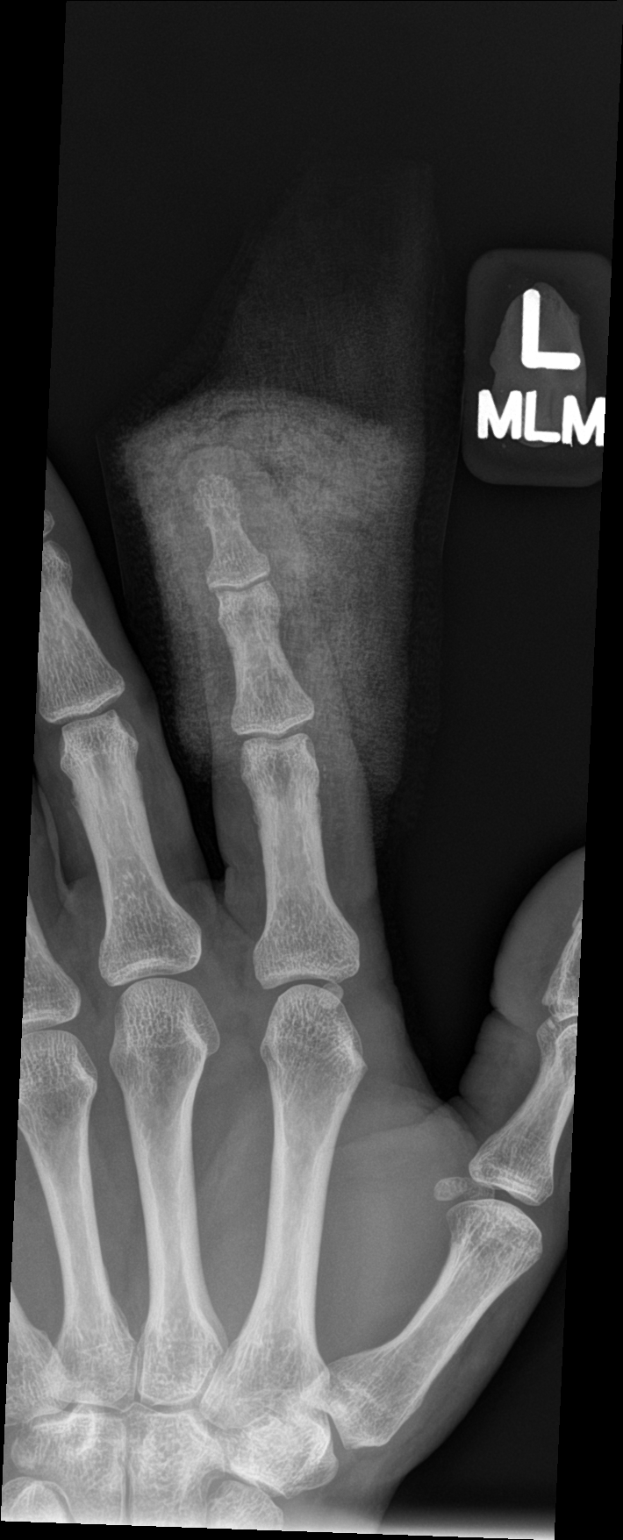
[im 2/3]
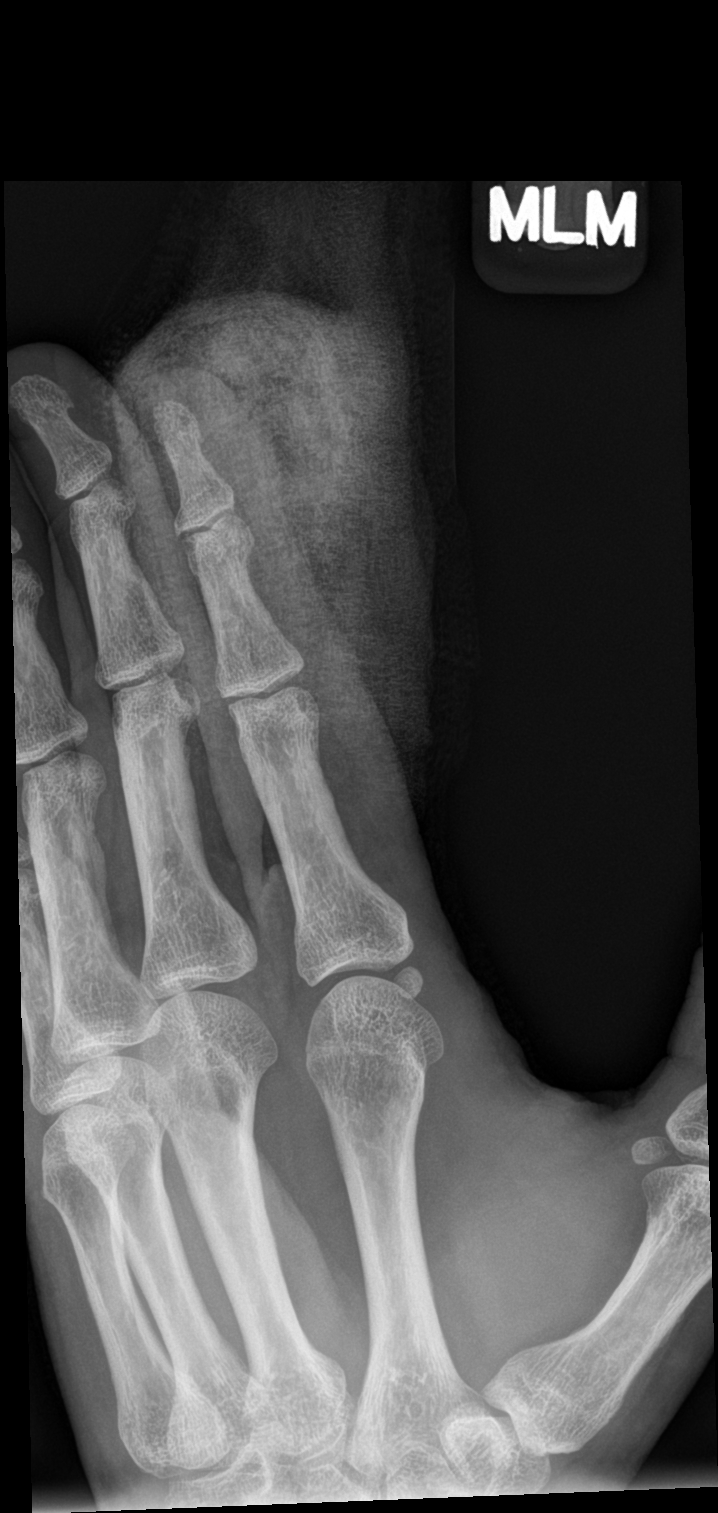
[im 3/3]
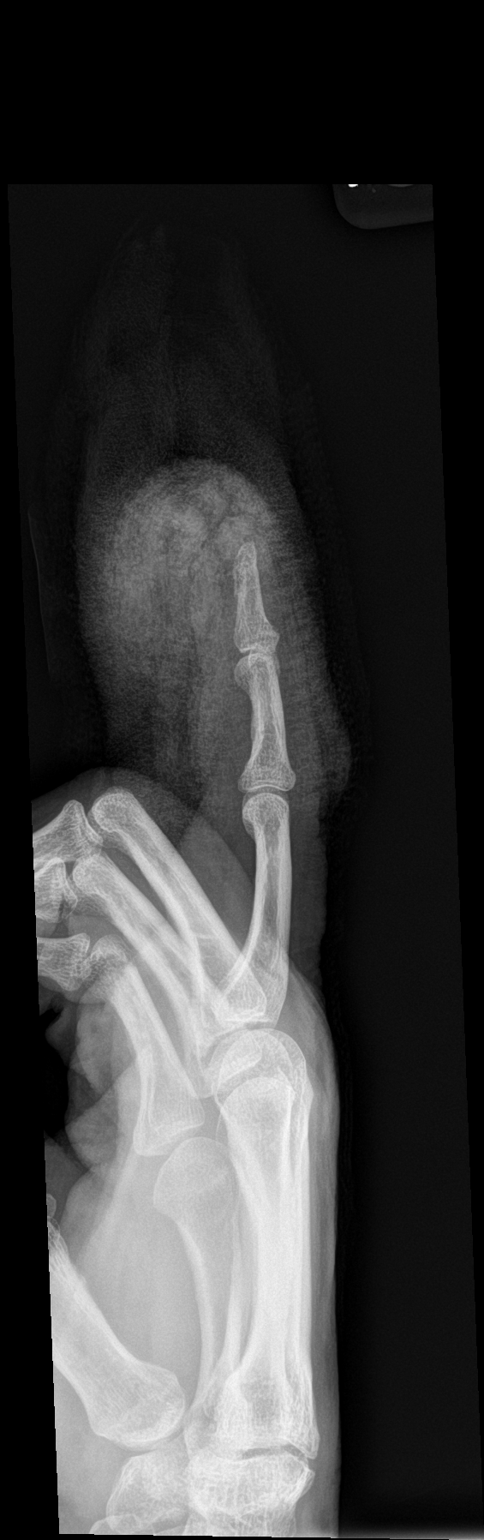

[3 of 3 positions shown; findings below may reference images not displayed]

FINDINGS: Overlying bandage material obscures soft tissue detail of the distal
index finger. No acute fracture or dislocation. No radiopaque
foreign body identified. Joint spaces are preserved.
IMPRESSION: 1. No acute osseous abnormality.
